# Patient Record
Sex: Male | Born: 1965 | Race: White | Hispanic: No | Marital: Married | Smoking: Former smoker
Health system: Southern US, Community
[De-identification: ages and names within clinical notes are randomized; demographics above are authoritative.]

## PROBLEM LIST (undated history)

## (undated) DIAGNOSIS — R809 Proteinuria, unspecified: Secondary | ICD-10-CM

## (undated) DIAGNOSIS — L97329 Non-pressure chronic ulcer of left ankle with unspecified severity: Secondary | ICD-10-CM

## (undated) DIAGNOSIS — E781 Pure hyperglyceridemia: Secondary | ICD-10-CM

## (undated) DIAGNOSIS — I10 Essential (primary) hypertension: Secondary | ICD-10-CM

## (undated) DIAGNOSIS — I639 Cerebral infarction, unspecified: Secondary | ICD-10-CM

## (undated) DIAGNOSIS — K219 Gastro-esophageal reflux disease without esophagitis: Secondary | ICD-10-CM

## (undated) DIAGNOSIS — T07XXXA Unspecified multiple injuries, initial encounter: Secondary | ICD-10-CM

## (undated) DIAGNOSIS — N183 Chronic kidney disease, stage 3 unspecified: Secondary | ICD-10-CM

## (undated) DIAGNOSIS — E1129 Type 2 diabetes mellitus with other diabetic kidney complication: Secondary | ICD-10-CM

## (undated) DIAGNOSIS — G8929 Other chronic pain: Secondary | ICD-10-CM

## (undated) DIAGNOSIS — N185 Chronic kidney disease, stage 5: Secondary | ICD-10-CM

## (undated) DIAGNOSIS — F209 Schizophrenia, unspecified: Secondary | ICD-10-CM

## (undated) DIAGNOSIS — I501 Left ventricular failure: Secondary | ICD-10-CM

## (undated) DIAGNOSIS — E785 Hyperlipidemia, unspecified: Secondary | ICD-10-CM

## (undated) DIAGNOSIS — G4730 Sleep apnea, unspecified: Secondary | ICD-10-CM

## (undated) DIAGNOSIS — E039 Hypothyroidism, unspecified: Secondary | ICD-10-CM

## (undated) DIAGNOSIS — I82402 Acute embolism and thrombosis of unspecified deep veins of left lower extremity: Secondary | ICD-10-CM

## (undated) DIAGNOSIS — F329 Major depressive disorder, single episode, unspecified: Secondary | ICD-10-CM

## (undated) DIAGNOSIS — E669 Obesity, unspecified: Secondary | ICD-10-CM

## (undated) DIAGNOSIS — M1990 Unspecified osteoarthritis, unspecified site: Secondary | ICD-10-CM

## (undated) DIAGNOSIS — E038 Other specified hypothyroidism: Secondary | ICD-10-CM

## (undated) DIAGNOSIS — F419 Anxiety disorder, unspecified: Secondary | ICD-10-CM

## (undated) DIAGNOSIS — M2550 Pain in unspecified joint: Secondary | ICD-10-CM

## (undated) DIAGNOSIS — M869 Osteomyelitis, unspecified: Secondary | ICD-10-CM

## (undated) DIAGNOSIS — D649 Anemia, unspecified: Secondary | ICD-10-CM

## (undated) DIAGNOSIS — F32A Depression, unspecified: Secondary | ICD-10-CM

## (undated) HISTORY — DX: Hypothyroidism, unspecified: E03.9

## (undated) HISTORY — DX: Pure hyperglyceridemia: E78.1

## (undated) HISTORY — DX: Acute embolism and thrombosis of unspecified deep veins of left lower extremity: I82.402

## (undated) HISTORY — DX: Type 2 diabetes mellitus with other diabetic kidney complication: E11.29

## (undated) HISTORY — DX: Obesity, unspecified: E66.9

## (undated) HISTORY — DX: Other specified hypothyroidism: E03.8

## (undated) HISTORY — PX: APPENDECTOMY: SHX54

## (undated) HISTORY — DX: Anxiety disorder, unspecified: F41.9

## (undated) HISTORY — DX: Major depressive disorder, single episode, unspecified: F32.9

## (undated) HISTORY — DX: Left ventricular failure, unspecified: I50.1

## (undated) HISTORY — DX: Unspecified multiple injuries, initial encounter: T07.XXXA

## (undated) HISTORY — DX: Non-pressure chronic ulcer of left ankle with unspecified severity: L97.329

## (undated) HISTORY — DX: Unspecified osteoarthritis, unspecified site: M19.90

## (undated) HISTORY — DX: Pain in unspecified joint: M25.50

## (undated) HISTORY — DX: Type 2 diabetes mellitus with other diabetic kidney complication: R80.9

## (undated) HISTORY — DX: Cerebral infarction, unspecified: I63.9

## (undated) HISTORY — DX: Chronic kidney disease, stage 5: N18.5

## (undated) HISTORY — DX: Osteomyelitis, unspecified: M86.9

## (undated) HISTORY — DX: Depression, unspecified: F32.A

---

## 2004-10-10 ENCOUNTER — Ambulatory Visit: Payer: Self-pay | Admitting: Occupational Therapy

## 2007-04-23 ENCOUNTER — Inpatient Hospital Stay: Payer: Self-pay | Admitting: Psychiatry

## 2008-03-09 ENCOUNTER — Ambulatory Visit: Payer: Self-pay | Admitting: Pain Medicine

## 2008-09-04 ENCOUNTER — Emergency Department (HOSPITAL_COMMUNITY): Admission: EM | Admit: 2008-09-04 | Discharge: 2008-09-04 | Payer: Self-pay | Admitting: Emergency Medicine

## 2008-11-01 ENCOUNTER — Inpatient Hospital Stay (HOSPITAL_COMMUNITY): Admission: EM | Admit: 2008-11-01 | Discharge: 2008-11-04 | Payer: Self-pay | Admitting: Emergency Medicine

## 2008-11-14 ENCOUNTER — Inpatient Hospital Stay (HOSPITAL_COMMUNITY): Admission: EM | Admit: 2008-11-14 | Discharge: 2008-11-16 | Payer: Self-pay | Admitting: Emergency Medicine

## 2008-11-14 ENCOUNTER — Ambulatory Visit: Payer: Self-pay | Admitting: Cardiology

## 2008-11-15 ENCOUNTER — Other Ambulatory Visit: Payer: Self-pay | Admitting: Internal Medicine

## 2008-11-15 ENCOUNTER — Ambulatory Visit: Payer: Self-pay | Admitting: Internal Medicine

## 2008-11-15 ENCOUNTER — Encounter: Payer: Self-pay | Admitting: Cardiology

## 2008-11-16 ENCOUNTER — Other Ambulatory Visit: Payer: Self-pay | Admitting: Internal Medicine

## 2009-06-20 ENCOUNTER — Emergency Department (HOSPITAL_COMMUNITY): Admission: EM | Admit: 2009-06-20 | Discharge: 2009-06-20 | Payer: Self-pay | Admitting: Emergency Medicine

## 2010-03-23 ENCOUNTER — Ambulatory Visit: Payer: Self-pay | Admitting: Gastroenterology

## 2010-05-30 ENCOUNTER — Inpatient Hospital Stay (HOSPITAL_COMMUNITY): Admission: EM | Admit: 2010-05-30 | Discharge: 2010-06-01 | Payer: Self-pay | Admitting: Emergency Medicine

## 2010-05-31 ENCOUNTER — Encounter (INDEPENDENT_AMBULATORY_CARE_PROVIDER_SITE_OTHER): Payer: Self-pay | Admitting: General Surgery

## 2010-06-12 ENCOUNTER — Emergency Department (HOSPITAL_COMMUNITY): Admission: EM | Admit: 2010-06-12 | Discharge: 2010-06-12 | Payer: Self-pay | Admitting: Emergency Medicine

## 2011-01-12 LAB — HEPATIC FUNCTION PANEL
AST: 16 U/L (ref 0–37)
Albumin: 4.3 g/dL (ref 3.5–5.2)
Alkaline Phosphatase: 75 U/L (ref 39–117)
Bilirubin, Direct: 0.1 mg/dL (ref 0.0–0.3)
Indirect Bilirubin: 0.9 mg/dL (ref 0.3–0.9)
Total Protein: 6.8 g/dL (ref 6.0–8.3)

## 2011-01-12 LAB — GLUCOSE, CAPILLARY
Glucose-Capillary: 307 mg/dL — ABNORMAL HIGH (ref 70–99)
Glucose-Capillary: 183 mg/dL — ABNORMAL HIGH (ref 70–99)
Glucose-Capillary: 193 mg/dL — ABNORMAL HIGH (ref 70–99)
Glucose-Capillary: 226 mg/dL — ABNORMAL HIGH (ref 70–99)
Glucose-Capillary: 244 mg/dL — ABNORMAL HIGH (ref 70–99)
Glucose-Capillary: 272 mg/dL — ABNORMAL HIGH (ref 70–99)
Glucose-Capillary: 286 mg/dL — ABNORMAL HIGH (ref 70–99)

## 2011-01-12 LAB — COMPREHENSIVE METABOLIC PANEL
ALT: 13 U/L (ref 0–53)
AST: 13 U/L (ref 0–37)
Albumin: 4.2 g/dL (ref 3.5–5.2)
Alkaline Phosphatase: 71 U/L (ref 39–117)
BUN: 7 mg/dL (ref 6–23)
CO2: 24 mEq/L (ref 19–32)
Chloride: 90 mEq/L — ABNORMAL LOW (ref 96–112)
Chloride: 104 mEq/L (ref 96–112)
Creatinine, Ser: 1.09 mg/dL (ref 0.4–1.5)
Creatinine, Ser: 0.89 mg/dL (ref 0.4–1.5)
GFR calc Af Amer: >60 mL/min (ref >60)
GFR calc non Af Amer: >60 mL/min (ref >60)
GFR calc non Af Amer: >60 mL/min (ref >60)
Glucose, Bld: 535 mg/dL — ABNORMAL HIGH (ref 70–99)
Glucose, Bld: 260 mg/dL — ABNORMAL HIGH (ref 70–99)
Total Bilirubin: 1.3 mg/dL — ABNORMAL HIGH (ref 0.3–1.2)

## 2011-01-12 LAB — URINALYSIS, ROUTINE W REFLEX MICROSCOPIC
Hgb urine dipstick: NEGATIVE
Ketones, ur: NEGATIVE mg/dL
Nitrite: NEGATIVE
Specific Gravity, Urine: >1.03 — ABNORMAL HIGH (ref 1.005–1.030)
Urobilinogen, UA: 0.2 mg/dL (ref 0.0–1.0)

## 2011-01-12 LAB — CBC
HCT: 44.2 % (ref 39.0–52.0)
Hemoglobin: 15 g/dL (ref 13.0–17.0)
Hemoglobin: 11.8 g/dL — ABNORMAL LOW (ref 13.0–17.0)
Hemoglobin: 13.9 g/dL (ref 13.0–17.0)
MCH: 29.7 pg (ref 26.0–34.0)
MCH: 30 pg (ref 26.0–34.0)
MCV: 85.1 fL (ref 78.0–100.0)
RBC: 4.68 MIL/uL (ref 4.22–5.81)
RDW: 13.1 % (ref 11.5–15.5)
WBC: 10.5 10*3/uL (ref 4.0–10.5)

## 2011-01-12 LAB — BASIC METABOLIC PANEL
BUN: 11 mg/dL (ref 6–23)
CO2: 19 mEq/L (ref 19–32)
Calcium: 9.5 mg/dL (ref 8.4–10.5)
Chloride: 101 mEq/L (ref 96–112)
Creatinine, Ser: 1.07 mg/dL (ref 0.4–1.5)
GFR calc Af Amer: >60 mL/min (ref >60)
GFR calc non Af Amer: >60 mL/min (ref >60)
Glucose, Bld: 393 mg/dL — ABNORMAL HIGH (ref 70–99)
Sodium: 132 mEq/L — ABNORMAL LOW (ref 135–145)

## 2011-01-12 LAB — DIFFERENTIAL
Basophils Absolute: 0 10*3/uL (ref 0.0–0.1)
Eosinophils Absolute: 0 10*3/uL (ref 0.0–0.7)
Eosinophils Absolute: 0.1 10*3/uL (ref 0.0–0.7)
Eosinophils Relative: 0 % (ref 0–5)
Eosinophils Relative: 1 % (ref 0–5)
Lymphocytes Relative: 19 % (ref 12–46)
Lymphs Abs: 2 10*3/uL (ref 0.7–4.0)
Monocytes Absolute: 0.6 10*3/uL (ref 0.1–1.0)
Monocytes Relative: 5 % (ref 3–12)
Neutro Abs: 6 10*3/uL (ref 1.7–7.7)
Neutro Abs: 6.1 10*3/uL (ref 1.7–7.7)
Neutrophils Relative %: 70 % (ref 43–77)

## 2011-01-12 LAB — CULTURE, BLOOD (ROUTINE X 2)
Culture: NO GROWTH
Report Status: 8202011
Report Status: 8202011

## 2011-01-12 LAB — LACTIC ACID, PLASMA: Lactic Acid, Venous: 1.7 mmol/L (ref 0.5–2.2)

## 2011-01-27 ENCOUNTER — Inpatient Hospital Stay (HOSPITAL_COMMUNITY)
Admission: EM | Admit: 2011-01-27 | Discharge: 2011-01-28 | DRG: 897 | Disposition: A | Discharge status: Home / Self Care | Payer: Medicaid Other | Attending: Internal Medicine | Admitting: Internal Medicine

## 2011-01-27 DIAGNOSIS — G609 Hereditary and idiopathic neuropathy, unspecified: Secondary | ICD-10-CM | POA: Diagnosis present

## 2011-01-27 DIAGNOSIS — IMO0001 Reserved for inherently not codable concepts without codable children: Secondary | ICD-10-CM | POA: Diagnosis present

## 2011-01-27 DIAGNOSIS — G894 Chronic pain syndrome: Secondary | ICD-10-CM | POA: Diagnosis present

## 2011-01-27 DIAGNOSIS — E876 Hypokalemia: Secondary | ICD-10-CM | POA: Diagnosis present

## 2011-01-27 DIAGNOSIS — Z794 Long term (current) use of insulin: Secondary | ICD-10-CM

## 2011-01-27 DIAGNOSIS — Z885 Allergy status to narcotic agent status: Secondary | ICD-10-CM

## 2011-01-27 DIAGNOSIS — F1120 Opioid dependence, uncomplicated: Secondary | ICD-10-CM | POA: Diagnosis present

## 2011-01-27 DIAGNOSIS — F19939 Other psychoactive substance use, unspecified with withdrawal, unspecified: Principal | ICD-10-CM | POA: Diagnosis present

## 2011-01-27 DIAGNOSIS — E785 Hyperlipidemia, unspecified: Secondary | ICD-10-CM | POA: Diagnosis present

## 2011-01-27 DIAGNOSIS — E860 Dehydration: Secondary | ICD-10-CM | POA: Diagnosis present

## 2011-01-27 LAB — DIFFERENTIAL
Basophils Absolute: 0 10*3/uL (ref 0.0–0.1)
Basophils Relative: 0 % (ref 0–1)
Eosinophils Absolute: 0 10*3/uL (ref 0.0–0.7)
Monocytes Absolute: 0.4 10*3/uL (ref 0.1–1.0)
Neutro Abs: 4.2 10*3/uL (ref 1.7–7.7)

## 2011-01-27 LAB — ETHANOL: Alcohol, Ethyl (B): <5 mg/dL (ref 0–10)

## 2011-01-27 LAB — COMPREHENSIVE METABOLIC PANEL
ALT: 17 U/L (ref 0–53)
Alkaline Phosphatase: 70 U/L (ref 39–117)
BUN: 10 mg/dL (ref 6–23)
CO2: 23 mEq/L (ref 19–32)
Chloride: 93 mEq/L — ABNORMAL LOW (ref 96–112)
GFR calc non Af Amer: >60 mL/min (ref >60)
Glucose, Bld: 427 mg/dL — ABNORMAL HIGH (ref 70–99)
Potassium: 3.2 mEq/L — ABNORMAL LOW (ref 3.5–5.1)
Total Bilirubin: 1.8 mg/dL — ABNORMAL HIGH (ref 0.3–1.2)

## 2011-01-27 LAB — CBC
Hemoglobin: 15.8 g/dL (ref 13.0–17.0)
MCHC: 36.8 g/dL — ABNORMAL HIGH (ref 30.0–36.0)
Platelets: 252 10*3/uL (ref 150–400)
RDW: 12.9 % (ref 11.5–15.5)

## 2011-01-27 LAB — RAPID URINE DRUG SCREEN, HOSP PERFORMED
Amphetamines: NOT DETECTED
Barbiturates: NOT DETECTED
Opiates: POSITIVE — AB

## 2011-01-27 LAB — GLUCOSE, CAPILLARY: Glucose-Capillary: 201 mg/dL — ABNORMAL HIGH (ref 70–99)

## 2011-01-28 LAB — CBC
HCT: 34.6 % — ABNORMAL LOW (ref 39.0–52.0)
MCH: 28.3 pg (ref 26.0–34.0)
MCV: 79.7 fL (ref 78.0–100.0)
Platelets: 208 10*3/uL (ref 150–400)
RDW: 13 % (ref 11.5–15.5)

## 2011-01-28 LAB — DIFFERENTIAL
Eosinophils Absolute: 0 10*3/uL (ref 0.0–0.7)
Eosinophils Relative: 0 % (ref 0–5)
Lymphocytes Relative: 31 % (ref 12–46)
Lymphs Abs: 1.7 10*3/uL (ref 0.7–4.0)
Monocytes Relative: 6 % (ref 3–12)

## 2011-01-28 LAB — COMPREHENSIVE METABOLIC PANEL
Albumin: 3.3 g/dL — ABNORMAL LOW (ref 3.5–5.2)
Alkaline Phosphatase: 48 U/L (ref 39–117)
BUN: 8 mg/dL (ref 6–23)
Chloride: 107 mEq/L (ref 96–112)
Creatinine, Ser: 0.83 mg/dL (ref 0.4–1.5)
Glucose, Bld: 207 mg/dL — ABNORMAL HIGH (ref 70–99)
Potassium: 2.9 mEq/L — ABNORMAL LOW (ref 3.5–5.1)
Total Bilirubin: 1.3 mg/dL — ABNORMAL HIGH (ref 0.3–1.2)

## 2011-01-28 LAB — PHOSPHORUS: Phosphorus: 2.4 mg/dL (ref 2.3–4.6)

## 2011-01-28 LAB — MAGNESIUM: Magnesium: 1.8 mg/dL (ref 1.5–2.5)

## 2011-01-28 LAB — GLUCOSE, CAPILLARY: Glucose-Capillary: 249 mg/dL — ABNORMAL HIGH (ref 70–99)

## 2011-01-29 NOTE — H&P (Addendum)
NAMESAMYOG, MOULTRIE NO.:  [id number]  MEDICAL RECORD NO.:  [id number]           PATIENT TYPE:  LOCATION:                                 FACILITY:  PHYSICIAN:  Erma Pinto, MD       DATE OF BIRTH:  07-12-1966  DATE OF ADMISSION: DATE OF DISCHARGE:  LH                             HISTORY & PHYSICAL   PRIMARY CARE PHYSICIAN:  Unassigned.  He is followed by pain physician, Dr. Staci Righter, his number is [phone].  CHIEF COMPLAINT:  Withdrawal symptoms.  HISTORY OF PRESENT ILLNESS:  [name] is a 45 year old Caucasian male who is on morphine for chronic pain for peripheral neuropathy and upper back and shoulder pain which is secondary to scar tissue from surgery he had for a large abscess in the upper back.  He is followed by [facility] and had been on high-dose morphine 120 mg XL three times a day and immediate release morphine 30 mg twice a day according to the patient.  He was taken off of the morphine and put on Lortab 10/325 q.i.d. 2 days ago and very shortly after that, he began having muscle cramps, nausea, vomiting, diaphoresis, just feeling really bad.  The patient also has diabetes, so he will get himself very dehydrated.  PAST MEDICAL HISTORY:  Hypertension, diabetes, hyperlipidemia, depression, and peripheral neuropathy, chronic pain.  PAST SURGICAL HISTORY:  He had appendectomy in August 2011 and had surgery for an abscess in his upper back, which has caused scar tissue and pain from it.  SOCIAL HISTORY:  He is disabled, married.  Does not drink, smoke, or use illicit drugs.  FAMILY HISTORY:  Noncontributory.  REVIEW OF SYSTEMS:  As per history of present illness.  LABORATORY DATA:  His white count is 5900, hemoglobin is 15.8, hematocrit 42.9 with normal differential.  His chemistry, his glucose originally was in the 400, his sodium 135, potassium 3.2, chloride 93, CO2 23, glucose initially was 427, most recent glucose was 201, BUN  is 10, creatinine 0.98, total bili is up 1.8, alkaline phosphatase 70. Rest of his comprehensive metabolic panel is normal.  Urine drug screen is pending.  X-rays, no chest x-ray was obtained.  The patient received in the emergency room Zofran 4 mg x1, then Ativan 2 mg x1, then morphine 4 mg x1, insulin he received 10 units IV push, Phenergan IV push times second time, and then Phenergan a third time. He has had another Ativan 1 mg IV and he has got IV fluids, normal saline at 150 mL.  PHYSICAL EXAMINATION:  GENERAL:  He initially was in lot of distress but his distress is resolving after receiving treatment in the ER. VITAL SIGNS:  Blood pressure is 154/86, pulse 87, respirations 24, temperature 97.9. HEENT:  Head is atraumatic, normocephalic.  Eyes, pupils equal, round, reactive to light.  Disk is sharp.  Extraocular muscle range of motion is full.  His sclerae are anicteric.  Ear, nose, and throat, mucous membranes are dry.  There is no erythema, exudate, or lesion noted. CHEST:  Nontender. LUNGS:  Clear to auscultation and percussion.  HEART:  Regular rhythm and rate.  Normal S1 and S2 without murmur, gallop, or rub. ABDOMEN:  He has diffuse tenderness.  His abdomen is soft.  There is no hepatomegaly.  No splenomegaly.  No palpable mass. GENITALIA:  Normal.  Rectal exam was deferred at the patient's request. SKIN:  There is no rash or lesions noted. NEUROLOGIC:  He is awake, alert, and oriented x4.  Cranial nerves II-XII intact.  Motor strength is 5/5 in upper and lower extremities.  Sensory intact to fine touch and pinprick. LYMPH NODES:  Normal. MENTAL STATUS:  Normal.  IMPRESSION: 1. Morphine withdrawal. 2. Hyperglycemic state. 3. Dehydration. 4. Nausea and vomiting. 5. Hypokalemia.  PLAN:  Admit the patient for IV hydration, monitor electrolytes, and replace electrolytes as needed.  Put the patient on telemetry, continue with Ativan 2 mg IV every 2 hours, put him on  morphine 4 mg IV every 2 hours as needed, clonidine 0.1 mg p.o. t.i.d. once he is able to take p.o. fluids and put him on a sliding scale and check sugars every 4 hours and then a.c. and at bedtime as tolerated.  The patient should be put on a methadone or Suboxone alternative to withdraw from the morphine, but I will leave that to the choice of the rounding hospitalist.  The patient will be admitted to [facility] Team 1.     Erma Pinto, MD     ML/MEDQ  D:  01/27/2011  T:  01/28/2011  Job:  [id number]  Electronically Signed by Erma Pinto  on 01/29/2011 11:13:57 AM

## 2011-02-03 LAB — CBC
HCT: 37.3 % — ABNORMAL LOW (ref 39.0–52.0)
MCV: 87.8 fL (ref 78.0–100.0)
Platelets: 207 10*3/uL (ref 150–400)
RDW: 12 % (ref 11.5–15.5)
WBC: 3.7 10*3/uL — ABNORMAL LOW (ref 4.0–10.5)

## 2011-02-03 LAB — DIFFERENTIAL
Basophils Absolute: 0 10*3/uL (ref 0.0–0.1)
Basophils Relative: 1 % (ref 0–1)
Eosinophils Absolute: 0.1 10*3/uL (ref 0.0–0.7)
Eosinophils Relative: 4 % (ref 0–5)
Lymphs Abs: 1.4 10*3/uL (ref 0.7–4.0)
Neutrophils Relative %: 51 % (ref 43–77)

## 2011-02-03 LAB — BASIC METABOLIC PANEL
BUN: 8 mg/dL (ref 6–23)
Chloride: 100 mEq/L (ref 96–112)
Creatinine, Ser: 0.8 mg/dL (ref 0.4–1.5)
GFR calc non Af Amer: >60 mL/min (ref >60)
Glucose, Bld: 318 mg/dL — ABNORMAL HIGH (ref 70–99)

## 2011-02-12 LAB — HEMOGLOBIN A1C: Hgb A1c MFr Bld: 7.1 % — ABNORMAL HIGH (ref 4.6–6.1)

## 2011-02-12 LAB — CBC
HCT: 37.4 % — ABNORMAL LOW (ref 39.0–52.0)
HCT: 37.5 % — ABNORMAL LOW (ref 39.0–52.0)
HCT: 36.4 % — ABNORMAL LOW (ref 39.0–52.0)
Hemoglobin: 13.1 g/dL (ref 13.0–17.0)
Hemoglobin: 13.2 g/dL (ref 13.0–17.0)
Hemoglobin: 15.1 g/dL (ref 13.0–17.0)
Hemoglobin: 12.8 g/dL — ABNORMAL LOW (ref 13.0–17.0)
Hemoglobin: 12.3 g/dL — ABNORMAL LOW (ref 13.0–17.0)
MCHC: 34.9 g/dL (ref 30.0–36.0)
MCHC: 35.3 g/dL (ref 30.0–36.0)
MCHC: 34.1 g/dL (ref 30.0–36.0)
MCHC: 35 g/dL (ref 30.0–36.0)
MCV: 92.2 fL (ref 78.0–100.0)
MCV: 92.7 fL (ref 78.0–100.0)
Platelets: 174 10*3/uL (ref 150–400)
Platelets: 185 10*3/uL (ref 150–400)
Platelets: 216 10*3/uL (ref 150–400)
RBC: 4.02 MIL/uL — ABNORMAL LOW (ref 4.22–5.81)
RBC: 4.69 MIL/uL (ref 4.22–5.81)
RBC: 3.81 MIL/uL — ABNORMAL LOW (ref 4.22–5.81)
RDW: 11.6 % (ref 11.5–15.5)
RDW: 12 % (ref 11.5–15.5)
RDW: 11.9 % (ref 11.5–15.5)
RDW: 12 % (ref 11.5–15.5)
WBC: 5.6 10*3/uL (ref 4.0–10.5)
WBC: 9 10*3/uL (ref 4.0–10.5)
WBC: 4 10*3/uL (ref 4.0–10.5)
WBC: 3.6 10*3/uL — ABNORMAL LOW (ref 4.0–10.5)

## 2011-02-12 LAB — GLUCOSE, CAPILLARY
Glucose-Capillary: 101 mg/dL — ABNORMAL HIGH (ref 70–99)
Glucose-Capillary: 143 mg/dL — ABNORMAL HIGH (ref 70–99)
Glucose-Capillary: 149 mg/dL — ABNORMAL HIGH (ref 70–99)
Glucose-Capillary: 236 mg/dL — ABNORMAL HIGH (ref 70–99)
Glucose-Capillary: 62 mg/dL — ABNORMAL LOW (ref 70–99)
Glucose-Capillary: 79 mg/dL (ref 70–99)
Glucose-Capillary: 115 mg/dL — ABNORMAL HIGH (ref 70–99)
Glucose-Capillary: 177 mg/dL — ABNORMAL HIGH (ref 70–99)
Glucose-Capillary: 206 mg/dL — ABNORMAL HIGH (ref 70–99)
Glucose-Capillary: 247 mg/dL — ABNORMAL HIGH (ref 70–99)

## 2011-02-12 LAB — BASIC METABOLIC PANEL
BUN: 6 mg/dL (ref 6–23)
BUN: 9 mg/dL (ref 6–23)
CO2: 25 mEq/L (ref 19–32)
CO2: 27 mEq/L (ref 19–32)
CO2: 32 mEq/L (ref 19–32)
CO2: 27 mEq/L (ref 19–32)
CO2: 25 mEq/L (ref 19–32)
Calcium: 8.8 mg/dL (ref 8.4–10.5)
Calcium: 9.1 mg/dL (ref 8.4–10.5)
Calcium: 9.4 mg/dL (ref 8.4–10.5)
Calcium: 9.2 mg/dL (ref 8.4–10.5)
Calcium: 8.6 mg/dL (ref 8.4–10.5)
Chloride: 99 mEq/L (ref 96–112)
Chloride: 107 mEq/L (ref 96–112)
Chloride: 108 mEq/L (ref 96–112)
Creatinine, Ser: 0.83 mg/dL (ref 0.4–1.5)
Creatinine, Ser: 0.92 mg/dL (ref 0.4–1.5)
Creatinine, Ser: 0.92 mg/dL (ref 0.4–1.5)
GFR calc Af Amer: >60 mL/min (ref >60)
GFR calc Af Amer: >60 mL/min (ref >60)
GFR calc non Af Amer: >60 mL/min (ref >60)
GFR calc non Af Amer: >60 mL/min (ref >60)
GFR calc non Af Amer: >60 mL/min (ref >60)
GFR calc non Af Amer: >60 mL/min (ref >60)
Glucose, Bld: 100 mg/dL — ABNORMAL HIGH (ref 70–99)
Glucose, Bld: 176 mg/dL — ABNORMAL HIGH (ref 70–99)
Glucose, Bld: 209 mg/dL — ABNORMAL HIGH (ref 70–99)
Glucose, Bld: 93 mg/dL (ref 70–99)
Glucose, Bld: 140 mg/dL — ABNORMAL HIGH (ref 70–99)
Potassium: 3.3 mEq/L — ABNORMAL LOW (ref 3.5–5.1)
Potassium: 4 mEq/L (ref 3.5–5.1)
Sodium: 133 mEq/L — ABNORMAL LOW (ref 135–145)
Sodium: 134 mEq/L — ABNORMAL LOW (ref 135–145)
Sodium: 137 mEq/L (ref 135–145)
Sodium: 138 mEq/L (ref 135–145)
Sodium: 142 mEq/L (ref 135–145)

## 2011-02-12 LAB — DIFFERENTIAL
Basophils Absolute: 0 10*3/uL (ref 0.0–0.1)
Basophils Absolute: 0 10*3/uL (ref 0.0–0.1)
Basophils Absolute: 0 10*3/uL (ref 0.0–0.1)
Basophils Absolute: 0 10*3/uL (ref 0.0–0.1)
Basophils Relative: 0 % (ref 0–1)
Basophils Relative: 0 % (ref 0–1)
Basophils Relative: 1 % (ref 0–1)
Basophils Relative: 1 % (ref 0–1)
Eosinophils Absolute: 0.1 10*3/uL (ref 0.0–0.7)
Eosinophils Absolute: 0.2 10*3/uL (ref 0.0–0.7)
Eosinophils Relative: 2 % (ref 0–5)
Eosinophils Relative: 3 % (ref 0–5)
Eosinophils Relative: 3 % (ref 0–5)
Eosinophils Relative: 4 % (ref 0–5)
Lymphocytes Relative: 14 % (ref 12–46)
Lymphocytes Relative: 27 % (ref 12–46)
Lymphocytes Relative: 20 % (ref 12–46)
Lymphs Abs: 1.3 10*3/uL (ref 0.7–4.0)
Lymphs Abs: 1.5 10*3/uL (ref 0.7–4.0)
Lymphs Abs: 0.8 10*3/uL (ref 0.7–4.0)
Monocytes Absolute: 0.5 10*3/uL (ref 0.1–1.0)
Monocytes Absolute: 0.5 10*3/uL (ref 0.1–1.0)
Monocytes Absolute: 0.5 10*3/uL (ref 0.1–1.0)
Monocytes Absolute: 0.5 10*3/uL (ref 0.1–1.0)
Monocytes Absolute: 0.4 10*3/uL (ref 0.1–1.0)
Monocytes Relative: 10 % (ref 3–12)
Monocytes Relative: 6 % (ref 3–12)
Neutro Abs: 2.5 10*3/uL (ref 1.7–7.7)
Neutro Abs: 3.9 10*3/uL (ref 1.7–7.7)
Neutro Abs: 7 10*3/uL (ref 1.7–7.7)
Neutro Abs: 2.5 10*3/uL (ref 1.7–7.7)
Neutrophils Relative %: 58 % (ref 43–77)
Neutrophils Relative %: 78 % — ABNORMAL HIGH (ref 43–77)
Neutrophils Relative %: 70 % (ref 43–77)

## 2011-02-12 LAB — CULTURE, BLOOD (ROUTINE X 2)
Culture: NO GROWTH
Culture: NO GROWTH
Culture: NO GROWTH
Report Status: 1232010

## 2011-02-12 LAB — CARDIAC PANEL(CRET KIN+CKTOT+MB+TROPI)
CK, MB: 0.9 ng/mL (ref 0.3–4.0)
CK, MB: 0.7 ng/mL (ref 0.3–4.0)
Relative Index: INVALID (ref 0.0–2.5)
Total CK: 50 U/L (ref 7–232)
Total CK: 48 U/L (ref 7–232)

## 2011-02-12 LAB — POCT CARDIAC MARKERS
CKMB, poc: <1 ng/mL — ABNORMAL LOW (ref 1.0–8.0)
Myoglobin, poc: 29.7 ng/mL (ref 12–200)
Troponin i, poc: <0.05 ng/mL (ref 0.00–0.09)

## 2011-02-12 LAB — LIPID PANEL
Cholesterol: 178 mg/dL (ref 0–200)
HDL: 35 mg/dL — ABNORMAL LOW (ref >39)
Total CHOL/HDL Ratio: 5.1 RATIO
Total CHOL/HDL Ratio: 4.6 RATIO
VLDL: 38 mg/dL (ref 0–40)

## 2011-02-12 LAB — D-DIMER, QUANTITATIVE: D-Dimer, Quant: 0.6 ug/mL-FEU — ABNORMAL HIGH (ref 0.00–0.48)

## 2011-02-14 NOTE — Discharge Summary (Signed)
Joe [name], [name]                ACCOUNT NO.:  [id number]  MEDICAL RECORD NO.:  [id number]           PATIENT TYPE:  I  LOCATION:  [id number]                          FACILITY:  [facility]  PHYSICIAN:  Eldin Bonsell L. Conley Canal, MDDATE OF BIRTH:  [birth year]  DATE OF ADMISSION:  01/27/2011 DATE OF DISCHARGE:  04/01/2012LH                              DISCHARGE SUMMARY   DISCHARGE DIAGNOSES: 1. Opiate withdrawal. 2. Chronic pain syndrome. 3. Diabetes. 4. Peripheral neuropathy. 5. Hypokalemia. 6. Uncontrolled diabetes. 7. Dehydration. 8. Hyperlipidemia.  DISCHARGE MEDICATIONS: 1. Dilaudid 2 mg 1-2 tablets p.o. q.4 h. p.r.n. pain, 30 were     dispensed. 2. Phenergan 25 mg p.o. q.6 h. p.r.n. nausea. 3. Potassium chloride 10 mEq 2 tablets q.i.d. for 2 days. 4. Continue alprazolam 1 mg p.o. q.i.d. p.r.n. anxiety. 5. Baclofen 10 mg p.o. q.i.d. p.r.n. muscle spasms. 6. Cymbalta 60 mg 2 tablets nightly. 7. Geodon 60 mg 2 tablets nightly. 8. Hydrocodone/APAP 10/500 one to two tablets p.o. q.4 h. p.r.n. pain. 9. Lantus insulin 90 units subcutaneously nightly. 10.Lisinopril 20 mg 2 tablets daily. 11.Lopid 600 mg a day. 12.Multivitamin a day. 13.Sliding scale NovoLog. 14.Stop MS Contin and MSIR.  CONDITION:  Stable.  CONSULTATIONS:  None.  PROCEDURES:  None.  FOLLOWUP:  Call his pain management physician tomorrow for instructions.  ACTIVITY:  Ad lib.  DIET:  Should be heart healthy, diabetic.  LABORATORY DATA:  CBC normal.  Complete metabolic panel significant for a potassium of 3.2 on admission, at discharge potassium is 2.9 and he is being repleted orally and IV.  Magnesium normal.  Total bilirubin on admission was 1.8, at discharge 1.3.  Glucose on admission 427, at discharge, ranging about 200.  Urine drug screen positive for benzodiazepines and opiates.  Blood alcohol less than 5.  HISTORY AND HOSPITAL COURSE:  Please see H and P for details.  Joe [name] is a 45 year old white male  with a history of chronic pain and chronic opiate use who presented with opiate withdrawal symptoms.  He was having severe pain everywhere.  He was vomiting, had diaphoresis, cramps, and diarrhea.  He had been on high-dose morphine 120 mg t.i.d. and immediate release morphine 30 mg twice a day, but had been switched to Lortab per his request as the morphine was no longer working. Apparently, it was felt that he had narco-toxicity with morphine.  The patient on admission had normal vital signs.  He had diffuse abdominal tenderness and a soft abdomen.  He was admitted for supportive care.  He was given IV fluids, IV potassium, antiemetics, clonidine, Ativan, and pain medication.  At the time of discharge, he is tolerating a regular diet.  He still has pain, but he is no longer having cramps, vomiting, or diarrhea.  He is requesting discharge and will call his pain management physician tomorrow.  He is unable to take more of his hydrocodone due to the Tylenol component.  He is allergic to OXYCODONE and does not wish to take morphine.  I have therefore given him 30 tablets of 2 mg Dilaudid.  I have also given him Phenergan and potassium  to take with him.  He is medically stable for discharge.     Annalynn Centanni L. Conley Canal, MD     CLS/MEDQ  D:  01/28/2011  T:  01/28/2011  Job:  [id number]  cc:   [facility] [address] [place], [place] [address]  Electronically Signed by Doree Barthel MD on 02/14/2011 09:33:36 PM

## 2011-03-13 NOTE — H&P (Signed)
[name], [name]                ACCOUNT NO.:  [id number]   MEDICAL RECORD NO.:  [id number]          PATIENT TYPE:  INP   LOCATION:  A310                          FACILITY:  [facility]   PHYSICIAN:  Salem Caster, DO    DATE OF BIRTH:  October 03, 1966   DATE OF ADMISSION:  11/14/2008  DATE OF DISCHARGE:  LH                              HISTORY & PHYSICAL   PRIMARY CARE PHYSICIAN:  [facility].   CHIEF COMPLAINT:  Chest pain.   HISTORY OF PRESENT ILLNESS:  This is a 45 year old Caucasian male who  presents with a 1-day history of intermittent substernal chest pain with  some palpitations with associated shortness of breath.  The patient was  recently seen the week prior at [facility] for a foot infection  and was sent home with IV vancomycin to a PICC line.  The patient states  that he is due to have his PICC line discontinued soon.  The patient  states that the pain is sharp in nature, is substernal with some  radiation to his abdomen and is intermittent.  The patient does have  some associated shortness of breath.  Denies any nausea, vomiting,  diarrhea.  The patient states that his foot still hurts.  It is better  but still has some swelling and pain.  The patient has no acute distress  on my exam at this time.   PAST MEDICAL HISTORY:  Includes:  1. Diabetes with severe peripheral neuropathy.  2. Dyslipidemia.  3. Hypertension.  4. Depression.   DRUG ALLERGIES:  On METFORMIN and PERCOCET.   HOME MEDICATIONS:  1. Lortab 10/325 mg 2 tablets 3 times a day.  2. Cymbalta 120 mg at bedtime.  3. Geodon 120 mg at bedtime.  4. Klonopin 1 mg 3 times.  5. Lantus 70 units subcutaneous bedtime.  6. Lidocaine 5% patch daily.  7. Lisinopril 10 mg daily.  8. Lovastatin 40 mg at bedtime.  9. NovoLog 10 units at breakfast, 6 units at lunch and 6 units at      supper coverage.  10.Tricor 145 mg daily.  11.Ultram 50 mg 3 times a day.   REVIEW OF SYSTEMS:  GENERAL:   No fever, chills, weight gain, weight  loss.  HEENT:  Unremarkable.  CARDIOVASCULAR:  Positive chest pain,  palpitations.  RESPIRATORY:  Positive for shortness of breath.  GASTROINTESTINAL:  Unremarkable.  GENITOURINARY:  Unremarkable.  MUSCULOSKELETAL:  Positive for foot pain.  Other systems unremarkable.   PHYSICAL EXAMINATION:  VITAL SIGNS:  Temperature is 98.5, pulse 73,  respirations 22, blood pressure 137/89, saturating 98% on room air.  GENERAL EXAMINATION:  He is well-nourished, well-hydrated, well-  developed in no acute distress.  HEENT:  Head is atraumatic, normocephalic.  No scleral icterus.  Oral  mucosa is moist.  NECK:  Soft, supple, nontender, nondistended.  No thyromegaly.  No JVD.  LUNGS:  Are clear auscultation.  No rales, rhonchi or wheezing.  CARDIOVASCULAR:  S1-S2 is regular, normal.  No murmurs, rubs or gallops.  ABDOMEN:  Is obese, soft.  Has tenderness in midepigastric  and left  sternal area on deep palpation.  No masses or hepatosplenomegaly.  Positive for bowel sounds.  EXTREMITIES:  No clubbing, cyanosis or edema.  The patient does have  some tenderness to his right foot on the plantar surface.  Old healed  wound noted.  NEUROLOGIC:  He is alert x3.  Cranial nerves II-XII are grossly intact.   LABORATORY DATA:  Last set of cardiac enzymes were CK-MB is less than 1,  troponin less than 0.05, myoglobin 29.7.  Sodium 138, potassium 3.7,  chloride 107, CO2 27, glucose 93, BUN 4, creatinine 0.85, D-dimer was  0.42.  White count 4.7, hemoglobin 12.8, hematocrit 37.4, platelet count  274,000.   RADIOLOGIC STUDIES:  CT angiogram of his chest showed:  1. No evidence of acute pulmonary embolism.  2. Small focus of __________ linear nodularity in the left lower lobe      has infectious or inflammatory pattern.   Chest x-ray showed the right PICC tip is difficult to visualize; was  probably in the SVC/RA junction; no pneumothorax.   ASSESSMENT:  This is a  45 year old Caucasian male who presents with  chest pain, substernal in nature, with some palpitations and shortness  of breath.  CT angiogram revealed no acute pulmonary embolus but did  reveal something suspicious for inflammatory or infectious process.   PLAN:  1. The patient admitted to the service of [facility] to a general      medical bed.  2. For his chest pain, we will get another set of cardiac enzymes.  So      far, his enzymes have been unremarkable.  The patient will be on IV      pain medications as well as aspirin.  The patient will be given      nitroglycerin as needed also.  I will get cardiology consult at      this time.  The patient may need stress testing in the near future      secondary to his family history.  3. For his probable pneumonia, the patient was placed on IV      antibiotics.  Will get sputum cultures and sensitivities as well as      blood cultures at this time.  Try to keep the patient's O2      saturation above greater than 90%.  4. For his insulin-dependent diabetes, the patient be placed on      sliding scale and will placed on his home on medications which      include Lantus.  5. For his of history of depression, the patient will be continued on      his home medications.  6. For his history of dyslipidemia and hypertension, he was also      placed on his home medications.  7. Lastly, the patient will be placed on DVT as well as GI      prophylaxis.   Anticipate the patient being discharged in next 24-48 hours after being  seen by cardiology due to no invasive procedures to be done.      Salem Caster, DO  Electronically Signed     SM/MEDQ  D:  11/15/2008  T:  11/15/2008  Job:  [id number]

## 2011-03-13 NOTE — Cardiovascular Report (Signed)
NAMEALDEAN, KILMER                ACCOUNT NO.:  [id number]   MEDICAL RECORD NO.:  [id number]          PATIENT TYPE:  INP   LOCATION:  2507                         FACILITY:  [facility]   PHYSICIAN:  [name], MDDATE OF BIRTH:  Nov 08, 1965   DATE OF PROCEDURE:  11/15/2008  DATE OF DISCHARGE:                            CARDIAC CATHETERIZATION   INDICATIONS:  Mr. Kaba is a very pleasant 45 year old male with 22-  year history of diabetes as well as hypertension, hyperlipidemia, and  obesity.  He was admitted with chest pain.  Cardiac markers have been  normal.  He has seen by Dr. Lattie Haw and referred for cardiac  catheterization due to suspicion of underlying coronary artery disease.   PROCEDURES PERFORMED:  1. Selective coronary angiography.  2. Left heart catheterization.  3. Left ventriculogram.  4. Star[employer] common femoral artery closure.   DESCRIPTION OF PROCEDURE:  The risks and indication were explained.  Consent was signed, placed on the chart.  A 5-French arterial sheath was  placed in the right femoral artery using a modified Seldinger technique.  Standard catheters including JL-4, JR-4, and angled pigtail were used  for procedure.  All catheter exchanges made over wire.  There were no  apparent complications.  Central aortic pressure was 144/91 with a mean  of 115.  LV pressure 153/15 with an EDP of 24.  There is no aortic  stenosis.   Left main was normal.   LAD was a large vessel wrapping the apex, gave off two diagonals.  There  were tandem 40% lesions in the mid section.   Left circumflex:  Gave off a large ramus and three OM branches.  There  is a 30% lesion in the mid section of the ramus and a 40% lesion in the  distal AV groove circumflex.   Right coronary artery was a large dominant vessel, gave off an RV  branch, PDA and two posterolaterals.  The second posterolateral was a  very large and covered most of the distal posterolateral wall.  It was  angiographically normal.   Left ventriculogram showed an EF of 65% with no regional wall motion  abnormalities.   ASSESSMENT:  1. Nonobstructive coronary artery disease.  2. Normal left ventricular function with elevated left ventricular end-      diastolic volume suggestive of diastolic dysfunction.   PLAN/DISCUSSION:  Given his angiography, I doubt his chest pain is  cardiac.  We will observe overnight and check a D-dimer.  If he remains  stable, will be discharged home in the morning.      [name], MD  Electronically Signed     DRB/MEDQ  D:  11/15/2008  T:  11/16/2008  Job:  [id number]

## 2011-03-13 NOTE — Group Therapy Note (Signed)
[name], [name]                ACCOUNT NO.:  [id number]   MEDICAL RECORD NO.:  [id number]          PATIENT TYPE:  INP   LOCATION:  A308                          FACILITY:  [facility]   PHYSICIAN:  Anselmo Pickler, DO    DATE OF BIRTH:  06/26/1966   DATE OF PROCEDURE:  DATE OF DISCHARGE:                                 PROGRESS NOTE   SUBJECTIVE:  The patient is saying that foot is still very painful.  He  is admitting pain with a foot pain medication.  Discussed with him plan  of care.  May set him up for home health care with a possible PICC line,  but we will discuss that with home health as well.  The patient has been  complaining of a sty of his left eye for the last 2-3 weeks.  It is also  very painful and bothering him.  We will start antibiotic therapy as  well.   PHYSICAL EXAMINATION:  VITAL SIGNS today:  Temperature 97.9, pulse 90,  respirations 18, blood pressure 121/65.  GENERAL:  The patient is well-developed, well-nourished in no acute  distress.  HEART:  Regular rate and rhythm.  LUNGS:  Clear to auscultation bilaterally.  ABDOMEN:  Soft, nontender and nondistended.  EXTREMITIES:  Foot has positive swelling with erythema.   LABORATORY DATA:  Labs for today are as follows:  White count 5.6,  hemoglobin 13.1, hematocrit 37.5, platelet count 124, sodium 137,  potassium 3.3, chloride 99, CO2 31, glucose 68, BUN 6, creatinine 0.80.   ASSESSMENT/PLAN:  1. Right foot cellulitis.  We will continue with antibiotic therapy.      Also we will set the patient up with a possible PICC line.  2. Hypokalemia.  We will replace with supplementation.  3. Sty of the left eye.  We will start Bleph-10 as directed.      Anselmo Pickler, DO  Electronically Signed     CB/MEDQ  D:  11/03/2008  T:  11/03/2008  Job:  [id number]

## 2011-03-13 NOTE — Discharge Summary (Signed)
[name], [name]                ACCOUNT NO.:  [id number]   MEDICAL RECORD NO.:  [id number]          PATIENT TYPE:  INP   LOCATION:  A310                          FACILITY:  [facility]   PHYSICIAN:  Bonnielee Haff, MD     DATE OF BIRTH:  Oct 18, 1966   DATE OF ADMISSION:  11/14/2008  DATE OF DISCHARGE:  01/18/2010LH                               DISCHARGE SUMMARY   Please review H and P dictated by Dr. Sherryle Lis for details of the  patient's presenting illness.   DISCHARGE DIAGNOSES:  1. Chest pain in the setting of multiple risk factors.  2. Recent admission for cellulitis.  3. Diabetes on insulin.  4. Depression.  5. Dyslipidemia.   This is a 45 year old Caucasian male who was initially admitted from  November 01, 2008, and I believe he was discharged on January 7 or 8, 2010  after being treated for cellulitis.  He was discharged on IV  antibiotics.  The patient presented to the ED with complaints of chest  pain.  He was having difficulty with his  PICC line that was not  flushing, and then when he tried to push through the IV, he experienced  chest pain shortly thereafter, so he came into the ED.  A CT was done,  which was negative for PE.  He was found to have a possible pneumonia.  The patient was started on antibiotics.  The patient was seen by [facility]  Cardiology, and they feel that, considering his multiple risk factors,  he needs a cardiac catheterization, so they transferred him to [facility] today.  I could not see the patient today before he was  transferred.   His final discharge instructions, medications, etc., will be dictated  when the patient is sent home from [facility]      Bonnielee Haff, MD  Electronically Signed     GK/MEDQ  D:  11/15/2008  T:  11/15/2008  Job:  [id number]

## 2011-03-13 NOTE — Consult Note (Signed)
NAMEJW, VELASCO                ACCOUNT NO.:  [id number]   MEDICAL RECORD NO.:  [id number]          PATIENT TYPE:  INP   LOCATION:  A310                          FACILITY:  [facility]   PHYSICIAN:  [name], MD, FACCDATE OF BIRTH:  1966/10/08   DATE OF CONSULTATION:  DATE OF DISCHARGE:  11/15/2008                                 CONSULTATION   REFERRING PHYSICIAN:  Dr. Sherryle Lis.   PRIMARY CARE PHYSICIAN:  [facility].   PRIMARY ENDOCRINOLOGIST:  [facility] and also followed at [facility] for peripheral neuropathy.   HISTORY OF PRESENT ILLNESS:  A 45 year old gentleman with a 12-year  history of diabetes, presenting with chest discomfort and dyspnea.   Mr. Jett was recently at [facility] due to a puncture wound  in the right foot with a surrounding cellulitis.  This has improved  substantially with intravenous antibiotics, which he continues to  receive via PICC line in his right arm.  Over the past 43 days, he has  noted intermittent left chest discomfort approximately at the level of  the fifth intercostal space with both sharp and pressure-like  components.  This is unrelated to exertion, body position, or time of  day.  There is a modest pleuritic component.  He has associated dyspnea  and diaphoresis without nausea.  There is radiation to the left arm with  numbness in the left arm.  The pain is moderate intensity.  Episodes  last minutes to hours.  He is given sublingual nitroglycerin in the  emergency department with some apparent benefit, but a subsequent dose  for recurrent spell has been in effective.  He has not had chest  discomfort in the past.  He has never seen a cardiologist nor undergone  any significant cardiac testing.   PAST MEDICAL HISTORY:  Otherwise notable for hyperlipidemia,  hypertension, and depression.  He has undergone two outpatient surgeries  for resection of a subcutaneous cyst from his right back.   CURRENT MEDICATIONS:  1. Lortab 20/625 mg t.i.d.  2. Cymbalta 120 mg at bedtime.  3. Geodon 120 mg at bedtime.  4. Klonopin 1 mg t.i.d.  5. Lantus insulin 70 units subcu at bedtime.  6. Lidocaine patch daily.  7. Lisinopril 10 mg daily.  8. Lovastatin 40 mg daily.  9. NovoLog 10 units at breakfast, 6 units at lunch, 6 units at supper.  10.Tricor 145 mg daily.  11.Ultram 50 mg t.i.d.   ALLERGIES:  Medical allergies are reported to METFORMIN and PERCOCET.   SOCIAL HISTORY:  No use of tobacco products nor alcohol.  He resides at  [facility] with his family.  Currently unemployed and receiving  disability.   FAMILY HISTORY:  Positive for diabetes, cardiac disease, and stroke.   REVIEW OF SYSTEMS:  Generalized weakness, easy fatigability; history of  minor asthmatic symptoms; problems with diabetic diet at home;  previously diagnosed with GERD.  He has a history of depression and  anxiety treated by a psychiatrist.  All other systems reviewed and are  negative.   PHYSICAL EXAMINATION:  GENERAL:  Overweight gentleman with a fairly flat  affect, complaining of chest discomfort, but appeared to be in no acute  distress.  VITAL SIGNS:  The temperature is 98.4,  heart rate 67 and regular,  respirations 24, blood pressure 160/96, O2 saturation 97% on room air.  Weight is 126 kg, height 74 inches.  HEENT:  Pupils equal, round, and react to light; normal lids and  conjunctiva; normal oral mucosa.  NECK:  No jugular venous distention; normal carotid upstrokes without  bruits.  ENDOCRINE:  No thyromegaly.  HEMATOPOIETIC:  No adenopathy.  SKIN:  No significant lesions.  LUNGS:  Few basilar rales but clear with cough.  CARDIAC:  Normal first and second heart sounds; fourth heart sound  present.  ABDOMEN:  Soft and nontender; normal bowel sounds; no masses; no  organomegaly.  EXTREMITIES:  Slight edema on the right; puncture wound over the second  metatarsal head that appears to be  resolving; no erythema or warmth.  Distal pulses intact.  NEUROLOGIC:  Symmetric strength and tone; decreased sensation in the  lower extremities.  Normal cranial nerves.   EKG during chest pain today:  Normal sinus rhythm; minimal J-point  elevation in lead I; shallow T-wave inversion in lead V2.  No prior  tracing for comparison.  Tracings from earlier hospitalizations are not  currently available for review.   Chest x-ray:  Cardiomegaly; otherwise unremarkable.  CT scan of the chest:  Increased density in the left lower lung field  that is vaguely described.   Other laboratory notable for normal CBC, normal D-dimer, CBG is 177 and  206, normal chemistry profile and negative cardiac markers.   IMPRESSION:  Mr. Wiederholt presents with chest discomfort in a worrisome  pattern with a description c/w ischemic origin but negative EKG during  his symptoms, negative cardiac markers, and an unremarkable physical  examination.  Due to his multiple cardiovascular risk factors, he will  likely require coronary angiography to definitively rule out ischemic  heart disease.  I will obtain his prior EKGs and review those as well as  review his CT scan with the radiologist looking for coronary  calcification.  We will attempt to relieve his symptoms with  sublingual and intravenous nitroglycerin.  He will be given aspirin,  intravenous heparin, and clopidogrel will be considered if his symptoms  become more worrisome.  We appreciate the request for consultation and  we will be happy to follow this nice gentleman with you.      [name], MD, [facility]  Electronically Signed     RMR/MEDQ  D:  11/15/2008  T:  11/15/2008  Job:  [id number]

## 2011-03-13 NOTE — Discharge Summary (Signed)
[name], [name]                ACCOUNT NO.:  [id number]   MEDICAL RECORD NO.:  [id number]          PATIENT TYPE:  INP   LOCATION:  2507                         FACILITY:  [facility]   PHYSICIAN:  [name], MDDATE OF BIRTH:  December 28, 1965   DATE OF ADMISSION:  11/15/2008  DATE OF DISCHARGE:  11/16/2008                               DISCHARGE SUMMARY   PRIMARY CARDIOLOGIST:  [name], MD, [facility]   PRIMARY CARE [name]:  [facility].   DISCHARGE DIAGNOSIS:  Chest pain.   SECONDARY DIAGNOSES:  1. Questionable community-acquired pneumonia.  2. Recent cellulitis of the right foot, treated with intravenous      vancomycin via peripherally inserted central catheter line on an      outpatient basis.  3. 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  4. Hypertension.  5. Hyperlipidemia.  6. Depression.  7. Chronic pain.   ALLERGIES:  METFORMIN and PERCOCET.   PROCEDURES:  Left heart cardiac catheterization performed on November 15, 2008, showing nonobstructive coronary artery disease, normal left  ventricular function, ejection fraction of 65%.   HISTORY OF PRESENT ILLNESS:  This is a 45 year old Caucasian male  without prior cardiac history.  He was recently admitted to [facility] in early January with right foot cellulitis and was  subsequently discharged home on IV vancomycin via PICC line.  On November 14, 2008, the patient developed chest pain after flushing of his PICC  line and subsequently presented to the [facility] ED.  There, he was  noted to have an elevated D-dimer and a CT of the chest was performed  and showed no PE, but there was question of left lower lobe pneumonia.  The patient was subsequently started on intravenous Levaquin.  He ruled  out for MI and was evaluated by Dr. Lattie Haw of [facility], and  given his risk factors, decision was made to pursue cardiac  catheterization.  The patient was then  transferred to [facility] on  November 15, 2008.   HOSPITAL COURSE:  The patient underwent left heart cardiac  catheterization on November 05, 2008, revealing nonobstructive coronary  artery disease, normal LV function, EF of 65%.  He has had no recurrent  chest pain and has been ambulating without difficulty.  We have titrated  his ACE inhibitor to lisinopril 20 mg daily for better blood pressure  control.  Otherwise, he will be discharged home today in good condition.   DISCHARGE LABORATORY DATA:  Hemoglobin 12.3, hematocrit 35.2, WBC 3.6,  platelets 183.  Sodium 142, potassium 4.0, chloride 108, CO2 25, BUN 5,  creatinine 0.72, glucose 140, calcium 8.6.  CK 48, MB 0.7, troponin I  less than 0.01.  Total cholesterol 170, triglycerides 189, HDL 37, LDL  95.  Blood cultures from [facility] on November 15, 2008, showed no  growth x2.   DISPOSITION:  The patient will be discharged home today in good  condition.   FOLLOWUP PLANS AND APPOINTMENTS:  We have asked him to follow up with  his primary care [name] at [facility] in 1-2 weeks.   DISCHARGE MEDICATIONS:  1. Lortab 10/325 mg 2 tablets t.i.d.  2. Cymbalta 120 mg nightly.  3. Geodon 120 mg nightly.  4. Klonopin 1 mg t.i.d.  5. Lantus 70 units subcu nightly.  6. Lidoderm 5% patch daily.  7. Lisinopril 20 mg daily.  8. Lovastatin 40 mg nightly.  9. NovoLog 10 units with breakfast, 6 units with lunch, 6 units with      supper.  10.TriCor 145 mg daily.  11.Levaquin 500 mg daily x5 additional days.  12.Ultram 50 mg t.i.d.   OUTSTANDING LABORATORY DATA AND STUDIES:  None.   DURATION OF DISCHARGE ENCOUNTER:  35 minutes including physician time.      [name], ANP      [name], MD  Electronically Signed    CB/MEDQ  D:  11/16/2008  T:  11/16/2008  Job:  [id number]   cc:   [facility]

## 2011-03-13 NOTE — Group Therapy Note (Signed)
[name], [name]                ACCOUNT NO.:  [id number]   MEDICAL RECORD NO.:  [id number]          PATIENT TYPE:  INP   LOCATION:  A308                          FACILITY:  [facility]   PHYSICIAN:  Bonnielee Haff, MD     DATE OF BIRTH:  1966-04-08   DATE OF PROCEDURE:  11/02/2008  DATE OF DISCHARGE:                                 PROGRESS NOTE   OBJECTIVE:  The patient is feeling slightly better.  He is feeling a  little bit drowsy because of his narcotic.  Pain in the legs still  persists at 8/10 in intensity.  He thinks that the leg looks better in  appearance.   OBJECTIVE:  He is afebrile, 97.9, heart rate 90, respiratory rate 16,  blood pressure 115/77, saturation 97% on room air.  LUNGS:  Clear to auscultation.  CARDIAC:  Unremarkable.  EXTREMITIES:  Right lower extremity shows improvement in erythema and  swelling.  Pain still persists.  Peripheral pulses are present.   LABORATORY DATA:  His white count is normal today.  Hemoglobin is  stable.  CBGs running 192, 149.  Sodium is 134.  HbA1c at 7.1.   ASSESSMENT/PLAN:  1. Right lower extremity cellulitis.  Continue Unasyn day #2.  He is      showing improvement.  It would probably take at least 2 more days      for him to significantly improve.  2. Insulin-dependent diabetes.  Continue with current diabetes      regimen.  He is on Lantus insulin.  He is also on Januvia  as well      as on Amaryl and also on Avandia.  3. Hypertension.  Continue with current antihypertensive agents.  4. Dyslipidemia,  5. He is on deep venous thrombosis prophylaxis.   So we await improvement in his cellulitis, and I am hoping in the next  couple of days he should be able to go home.      Bonnielee Haff, MD  Electronically Signed     GK/MEDQ  D:  11/02/2008  T:  11/02/2008  Job:  [id number]   cc:   [facility]

## 2011-03-13 NOTE — H&P (Signed)
[name], [name] NO.:  [id number]   MEDICAL RECORD NO.:  [id number]          PATIENT TYPE:  EMS   LOCATION:  ED                            FACILITY:  [facility]   PHYSICIAN:  Bonnielee Haff, MD     DATE OF BIRTH:  1966/01/11   DATE OF ADMISSION:  11/01/2008  DATE OF DISCHARGE:  LH                              HISTORY & PHYSICAL   Patient's PMD is [facility].  He also is followed by  a psychiatrist, by the [facility] at [facility], and also by an  endocrinologist at the [facility] in [place], [place].   ADMITTING DIAGNOSES:  1. Right lower extremity cellulitis.  2. Insulin-dependent diabetes.  3. Dyslipidemia.  4. Peripheral neuropathy.  5. Depression.   CHIEF COMPLAINT:  Right foot pain.   HISTORY OF PRESENT ILLNESS:  The patient is a 45 year old white male who  has diabetes for the last 12 years with severe peripheral neuropathy  affecting his upper as well as lower extremities, who was in his usual  state of health when he started noticing pain in his foot last night and  this morning.  Then he noticed some blood on the floor when he was  walking, so he checked his foot and noticed that he had a wound on the  sole of his foot on the right side.  The patient went to his boots and  saw that there was a nail that was lying inside his boots, so he  apparently stepped on this nail sometime yesterday and did not realize.  He experienced some chills but denies any fever.  His foot has been  getting warmer and more tender as hours have gone by.  The pain was  10/10 in intensity, which was a dull aching pain, decreasing to 4/10 in  intensity after pain medications.  Denies any nausea or vomiting,  diarrhea.  Denies any previous episodes of cellulitis.  He also admits  to swelling in his right foot.   MEDICATIONS AT HOME:  1. He is on Abilify 2 mg daily.  2. Avandia 8 mg once daily.  3. Cymbalta 120 mg at bedtime.  4. Geodon 120 mg at  bedtime.  5. Klonopin 1 mg t.i.d.  6. Lantus 70 units at bedtime.  7. Lidoderm patch 5% to both his feet, he applies for 8 hours everyday      from 12 p.m. to 6 a.m.  8. Lisinopril 10 mg once daily .  9. Lovastatin 40 mg once daily.  10.NovoLog on a sliding scale.  11.Januvia 100 mg daily .  12.TriCor 145 mg daily.  13.Ultram 50 mg 3 times daily.  14.He is also on glimepiride 4 mg daily.  15.He was taken off of Topamax recently.   ALLERGIES:  Include METFORMIN which causes nausea and PERCOCET which  causes hives.   PAST MEDICAL HISTORY:  1. Positive for diabetes for the past 12 years with history of severe      peripheral neuropathy affecting his upper and lower extremities.  2. History of dyslipidemia.  3. Hypertension.  4. Depression.  5. Cyst removal from the back.   SOCIAL HISTORY:  Lives in [place] with his family.  No smoking,  alcohol or illicit drug use.  He is currently on disability.   FAMILY HISTORY:  Positive for diabetes, heart disease, and stroke.   REVIEW OF SYSTEMS:  GENERAL REVIEW OF SYSTEMS:  Positive for weakness.  HEENT:  Unremarkable.  CARDIOVASCULAR:  Unremarkable.  RESPIRATORY:  Unremarkable.  GI:  Unremarkable.  GU:  Unremarkable.  MUSCULOSKELETAL:  As in HPI.  NEUROLOGICAL:  As in HPI.  PSYCHIATRIC:  Unremarkable.  DERMATOLOGIC:  As in HPI.   PHYSICAL EXAMINATION:  VITAL SIGNS:  Temperature 97.8, blood pressure  153/101 and then dropping down to 128/70, heart rate 106, respiratory  rate 20, saturation 98% on room air.  GENERAL EXAM:  Overweight white male in no distress.  HEENT:  There is no pallor, no icterus.  Oral mucus membrane is moist.  No oral lesions are noted.  NECK:  Soft and supple, no thyromegaly is appreciated.  LUNGS:  Clear to auscultation bilaterally and no wheezing, rales or  rhonchi.  CARDIOVASCULAR:  S1, S2 are normal, regular, no murmurs appreciated.  No  S3-S4, no rubs, no bruits.  ABDOMEN:  Soft, nontender,  nondistended, bowel sounds are present.  No  masses or organomegaly is appreciated.  MUSCULOSKELETAL EXAM:  Unremarkable.  NEUROLOGICALLY:  He is alert, oriented x3, no focal neurologic deficits  are present.  Examination of the right lower extremity reveals swollen  foot, especially the first 3 toes.  He has got a puncture wound on the  plantar aspect of that foot near the toes and the area is tender to  palpation.  It is swollen, erythematous, the erythema extends up to  about mid leg.  The foot and the leg are also warm to touch.   LAB DATA:  His white count is normal at 9.0, neutrophils 78%, hemoglobin  is 15.1, MCV is 92, platelet count is 225.  Sodium is 133, his glucose  is 197, renal function is normal.  He had a foot x-ray which did not  show any acute findings.   ASSESSMENT:  This is a 45 year old Caucasian male with diabetes who  stepped on a nail yesterday and did not realize until he experienced  pain this morning and now has evidence for right lower extremity  cellulitis.  Because he is a diabetic and because the spread of  infection was rather rapid, I think he merits inpatient admission for IV  antibiotics.   1. Right lower extremity cellulitis secondary to trauma:  We will put      him on Unasyn 3 g every 6 hours, give him pain medications.  I will      check his tetanus status as that vaccination may have to be given      if he has not received it recently.  2. Insulin-dependent diabetes:  We will put him on a sliding scale,      continue with his Lantus,  Avandia and Januvia.  Check HbA1c as      well.  3. Severe peripheral neuropathy:  The patient might benefit from being      on Neurontin, however, I would defer this to his PMD .  We will      continue with the Lidoderm patches for his Dupuytren contractures.  4. Depression:  Continue with Cymbalta, Geodon and Abilify.  5. Deep venous thrombophlebitis:  Will utilize.   Further management  decisions will depend  on the results of further  testing and the patient's response to treatment.  Blood cultures will  also be obtained.   Anticipate patient staying in the hospital about 2-3 days.      Bonnielee Haff, MD  Electronically Signed     GK/MEDQ  D:  11/01/2008  T:  11/01/2008  Job:  [id number]   cc:   [facility]

## 2011-03-16 NOTE — Discharge Summary (Signed)
NAMEMESIAH, MACAK                ACCOUNT NO.:  [id number]   MEDICAL RECORD NO.:  [id number]          PATIENT TYPE:  INP   LOCATION:  A308                          FACILITY:  [facility]   PHYSICIAN:  Anselmo Pickler, DO    DATE OF BIRTH:  [birth year]   DATE OF ADMISSION:  11/01/2008  DATE OF DISCHARGE:  01/07/2010LH                               DISCHARGE SUMMARY   ADMISSION DIAGNOSES:  1. Right lower extremity cellulitis.  2. Insulin-dependent diabetes.  3. Dyslipidemia.  4. Peripheral neuropathy.  5. Depression.   DISCHARGE DIAGNOSES:  1. Right lower extremity cellulitis.  2. Insulin-dependent diabetes.  3. Dyslipidemia.  4. Peripheral neuropathy.  5. Depression.   His H and P was done by Dr. Maryland Pink, please refer for history of  present illness.   HOSPITAL COURSE:  The patient was admitted for the above diagnoses.  He  was started on IV antibiotics.  It was determined that he was getting  better and the patient was getting anxious to go home so a PICC line was  placed and home health care was initiated for the patient to be able to  receive services at home.  He would be placed on vancomycin for the next  week.  Also he was seen by diabetes management while he was here.   His medications that he was sent home on include:  1. Avandia 8 mg p.o. daily.  2. Januvia 100 mg p.o. daily.  3. Lisinopril 10 mg p.o. daily.  4. Lovastatin 40 mg p.o. daily.  5. Tramadol 50 mg 3 times a day.  6. Tricor 145 mg p.o. daily.  7. NovoLog sliding scale, specialized dosing.  8. Lantus, no dose given.  9. Klonopin 1 mg 3 times daily.  10.Geodon  60 mg 2 capsules daily.  11.Cymbalta 60 mg at bedtime.  12.Lidoderm patch no dose given.  13.New medication he was given is Vicodin HP 1 p.o. q.6 h p.r.n. #20.   DISCHARGE INSTRUCTIONS:  Was to follow up with [facility].  To stay on an ADA diet.  Home health care was set up for him, and to  increase his activity slowly.  He was set up  for an appointment for that  following Friday or Monday and his IV vancomycin was set up for home  health for 7 days after discharge.      Anselmo Pickler, DO  Electronically Signed     CB/MEDQ  D:  11/24/2008  T:  11/24/2008  Job:  [id number]

## 2011-06-20 ENCOUNTER — Encounter: Payer: Self-pay | Admitting: Cardiothoracic Surgery

## 2011-06-20 ENCOUNTER — Encounter: Payer: Self-pay | Admitting: Nurse Practitioner

## 2011-06-30 ENCOUNTER — Encounter: Payer: Self-pay | Admitting: Nurse Practitioner

## 2011-06-30 ENCOUNTER — Encounter: Payer: Self-pay | Admitting: Cardiothoracic Surgery

## 2011-07-30 ENCOUNTER — Encounter: Payer: Self-pay | Admitting: Cardiothoracic Surgery

## 2011-07-30 ENCOUNTER — Encounter: Payer: Self-pay | Admitting: Nurse Practitioner

## 2011-08-30 ENCOUNTER — Encounter: Payer: Self-pay | Admitting: Nurse Practitioner

## 2011-08-30 ENCOUNTER — Encounter: Payer: Self-pay | Admitting: Cardiothoracic Surgery

## 2012-03-31 ENCOUNTER — Encounter (HOSPITAL_COMMUNITY): Payer: Self-pay | Admitting: Emergency Medicine

## 2012-03-31 ENCOUNTER — Emergency Department (HOSPITAL_COMMUNITY)
Admission: EM | Admit: 2012-03-31 | Discharge: 2012-03-31 | Discharge status: Against Medical Advice | Payer: Medicaid Other | Attending: Emergency Medicine | Admitting: Emergency Medicine

## 2012-03-31 DIAGNOSIS — M549 Dorsalgia, unspecified: Secondary | ICD-10-CM | POA: Insufficient documentation

## 2012-03-31 HISTORY — DX: Hyperlipidemia, unspecified: E78.5

## 2012-03-31 HISTORY — DX: Other chronic pain: G89.29

## 2012-03-31 HISTORY — DX: Essential (primary) hypertension: I10

## 2012-03-31 NOTE — ED Notes (Signed)
Pain in back shooting down right leg. Denies injury

## 2012-03-31 NOTE — ED Notes (Signed)
Pt is patient at pain clinic in [place]. Pt states has pain in neck/back/legs chronic

## 2012-10-29 HISTORY — PX: AMPUTATION TOE: SHX6595

## 2016-06-24 ENCOUNTER — Encounter: Payer: Self-pay | Admitting: Emergency Medicine

## 2016-06-24 DIAGNOSIS — Z79899 Other long term (current) drug therapy: Secondary | ICD-10-CM

## 2016-06-24 DIAGNOSIS — N183 Chronic kidney disease, stage 3 (moderate): Secondary | ICD-10-CM | POA: Diagnosis present

## 2016-06-24 DIAGNOSIS — B9561 Methicillin susceptible Staphylococcus aureus infection as the cause of diseases classified elsewhere: Secondary | ICD-10-CM | POA: Diagnosis present

## 2016-06-24 DIAGNOSIS — N170 Acute kidney failure with tubular necrosis: Principal | ICD-10-CM | POA: Diagnosis present

## 2016-06-24 DIAGNOSIS — E1140 Type 2 diabetes mellitus with diabetic neuropathy, unspecified: Secondary | ICD-10-CM | POA: Diagnosis present

## 2016-06-24 DIAGNOSIS — E7800 Pure hypercholesterolemia, unspecified: Secondary | ICD-10-CM | POA: Diagnosis present

## 2016-06-24 DIAGNOSIS — L97519 Non-pressure chronic ulcer of other part of right foot with unspecified severity: Secondary | ICD-10-CM | POA: Diagnosis present

## 2016-06-24 DIAGNOSIS — M86671 Other chronic osteomyelitis, right ankle and foot: Secondary | ICD-10-CM | POA: Diagnosis present

## 2016-06-24 DIAGNOSIS — Z881 Allergy status to other antibiotic agents status: Secondary | ICD-10-CM

## 2016-06-24 DIAGNOSIS — E878 Other disorders of electrolyte and fluid balance, not elsewhere classified: Secondary | ICD-10-CM | POA: Diagnosis present

## 2016-06-24 DIAGNOSIS — R45851 Suicidal ideations: Secondary | ICD-10-CM | POA: Diagnosis present

## 2016-06-24 DIAGNOSIS — E11621 Type 2 diabetes mellitus with foot ulcer: Secondary | ICD-10-CM | POA: Diagnosis present

## 2016-06-24 DIAGNOSIS — Z6837 Body mass index (BMI) 37.0-37.9, adult: Secondary | ICD-10-CM

## 2016-06-24 DIAGNOSIS — E1122 Type 2 diabetes mellitus with diabetic chronic kidney disease: Secondary | ICD-10-CM | POA: Diagnosis present

## 2016-06-24 DIAGNOSIS — I129 Hypertensive chronic kidney disease with stage 1 through stage 4 chronic kidney disease, or unspecified chronic kidney disease: Secondary | ICD-10-CM | POA: Diagnosis present

## 2016-06-24 DIAGNOSIS — E1169 Type 2 diabetes mellitus with other specified complication: Secondary | ICD-10-CM | POA: Diagnosis present

## 2016-06-24 DIAGNOSIS — Z888 Allergy status to other drugs, medicaments and biological substances status: Secondary | ICD-10-CM

## 2016-06-24 DIAGNOSIS — Z794 Long term (current) use of insulin: Secondary | ICD-10-CM

## 2016-06-24 DIAGNOSIS — Z89422 Acquired absence of other left toe(s): Secondary | ICD-10-CM

## 2016-06-24 DIAGNOSIS — Z885 Allergy status to narcotic agent status: Secondary | ICD-10-CM

## 2016-06-24 DIAGNOSIS — E669 Obesity, unspecified: Secondary | ICD-10-CM | POA: Diagnosis present

## 2016-06-24 DIAGNOSIS — F250 Schizoaffective disorder, bipolar type: Secondary | ICD-10-CM | POA: Diagnosis present

## 2016-06-24 DIAGNOSIS — Z7901 Long term (current) use of anticoagulants: Secondary | ICD-10-CM

## 2016-06-24 DIAGNOSIS — E1165 Type 2 diabetes mellitus with hyperglycemia: Secondary | ICD-10-CM | POA: Diagnosis present

## 2016-06-24 DIAGNOSIS — B951 Streptococcus, group B, as the cause of diseases classified elsewhere: Secondary | ICD-10-CM | POA: Diagnosis present

## 2016-06-24 DIAGNOSIS — E860 Dehydration: Secondary | ICD-10-CM | POA: Diagnosis present

## 2016-06-24 DIAGNOSIS — Z86718 Personal history of other venous thrombosis and embolism: Secondary | ICD-10-CM

## 2016-06-24 LAB — COMPREHENSIVE METABOLIC PANEL
ALK PHOS: 111 U/L (ref 38–126)
ALT: 13 U/L — AB (ref 17–63)
ANION GAP: 12 (ref 5–15)
AST: 22 U/L (ref 15–41)
Albumin: 3.1 g/dL — ABNORMAL LOW (ref 3.5–5.0)
BILIRUBIN TOTAL: 0.4 mg/dL (ref 0.3–1.2)
BUN: 28 mg/dL — ABNORMAL HIGH (ref 6–20)
CALCIUM: 9.2 mg/dL (ref 8.9–10.3)
CO2: 22 mmol/L (ref 22–32)
CREATININE: 2.75 mg/dL — AB (ref 0.61–1.24)
Chloride: 98 mmol/L — ABNORMAL LOW (ref 101–111)
GFR calc non Af Amer: 26 mL/min — ABNORMAL LOW (ref >60)
GFR, EST AFRICAN AMERICAN: 30 mL/min — AB (ref >60)
Glucose, Bld: 311 mg/dL — ABNORMAL HIGH (ref 65–99)
Potassium: 3.2 mmol/L — ABNORMAL LOW (ref 3.5–5.1)
Sodium: 132 mmol/L — ABNORMAL LOW (ref 135–145)
TOTAL PROTEIN: 6.8 g/dL (ref 6.5–8.1)

## 2016-06-24 LAB — ETHANOL: Alcohol, Ethyl (B): <5 mg/dL (ref <5)

## 2016-06-24 LAB — CBC
HEMATOCRIT: 40.1 % (ref 40.0–52.0)
Hemoglobin: 14.3 g/dL (ref 13.0–18.0)
MCH: 30.8 pg (ref 26.0–34.0)
MCHC: 35.6 g/dL (ref 32.0–36.0)
MCV: 86.3 fL (ref 80.0–100.0)
Platelets: 282 10*3/uL (ref 150–440)
RBC: 4.65 MIL/uL (ref 4.40–5.90)
RDW: 13.1 % (ref 11.5–14.5)
WBC: 7.3 10*3/uL (ref 3.8–10.6)

## 2016-06-24 LAB — SALICYLATE LEVEL

## 2016-06-24 LAB — ACETAMINOPHEN LEVEL

## 2016-06-24 NOTE — ED Triage Notes (Signed)
Pt states he has felt suicidal and it has been increasing over last 3 weeks. Pt states he feels like he is actively going to hurt himself. Pt will have a staff member with him at all times in triage. Pt denies any ingestions or attempts to harm self, but states has made a plan to harm himself.

## 2016-06-24 NOTE — ED Notes (Signed)
juanetta sitting with pt within one arm's length for suicidal precautions.

## 2016-06-25 ENCOUNTER — Inpatient Hospital Stay: Payer: Medicaid Other

## 2016-06-25 ENCOUNTER — Encounter: Payer: Self-pay | Admitting: Emergency Medicine

## 2016-06-25 ENCOUNTER — Emergency Department: Payer: Medicaid Other

## 2016-06-25 ENCOUNTER — Inpatient Hospital Stay
Admission: EM | Admit: 2016-06-25 | Discharge: 2016-06-26 | DRG: 683 | Disposition: A | Discharge status: Other Acute Inpatient Hospital | Payer: Medicaid Other | Attending: Internal Medicine | Admitting: Internal Medicine

## 2016-06-25 DIAGNOSIS — R45851 Suicidal ideations: Secondary | ICD-10-CM

## 2016-06-25 DIAGNOSIS — Z86718 Personal history of other venous thrombosis and embolism: Secondary | ICD-10-CM | POA: Diagnosis not present

## 2016-06-25 DIAGNOSIS — F203 Undifferentiated schizophrenia: Secondary | ICD-10-CM

## 2016-06-25 DIAGNOSIS — Z046 Encounter for general psychiatric examination, requested by authority: Secondary | ICD-10-CM

## 2016-06-25 DIAGNOSIS — L97519 Non-pressure chronic ulcer of other part of right foot with unspecified severity: Secondary | ICD-10-CM | POA: Diagnosis present

## 2016-06-25 DIAGNOSIS — B9561 Methicillin susceptible Staphylococcus aureus infection as the cause of diseases classified elsewhere: Secondary | ICD-10-CM | POA: Diagnosis present

## 2016-06-25 DIAGNOSIS — E1169 Type 2 diabetes mellitus with other specified complication: Secondary | ICD-10-CM | POA: Diagnosis present

## 2016-06-25 DIAGNOSIS — Z888 Allergy status to other drugs, medicaments and biological substances status: Secondary | ICD-10-CM | POA: Diagnosis not present

## 2016-06-25 DIAGNOSIS — F250 Schizoaffective disorder, bipolar type: Secondary | ICD-10-CM

## 2016-06-25 DIAGNOSIS — M86671 Other chronic osteomyelitis, right ankle and foot: Secondary | ICD-10-CM | POA: Diagnosis present

## 2016-06-25 DIAGNOSIS — E11621 Type 2 diabetes mellitus with foot ulcer: Secondary | ICD-10-CM | POA: Diagnosis present

## 2016-06-25 DIAGNOSIS — N183 Chronic kidney disease, stage 3 (moderate): Secondary | ICD-10-CM | POA: Diagnosis present

## 2016-06-25 DIAGNOSIS — E878 Other disorders of electrolyte and fluid balance, not elsewhere classified: Secondary | ICD-10-CM | POA: Diagnosis present

## 2016-06-25 DIAGNOSIS — E1140 Type 2 diabetes mellitus with diabetic neuropathy, unspecified: Secondary | ICD-10-CM | POA: Diagnosis present

## 2016-06-25 DIAGNOSIS — N179 Acute kidney failure, unspecified: Secondary | ICD-10-CM | POA: Diagnosis present

## 2016-06-25 DIAGNOSIS — Z885 Allergy status to narcotic agent status: Secondary | ICD-10-CM | POA: Diagnosis not present

## 2016-06-25 DIAGNOSIS — M869 Osteomyelitis, unspecified: Secondary | ICD-10-CM

## 2016-06-25 DIAGNOSIS — E7800 Pure hypercholesterolemia, unspecified: Secondary | ICD-10-CM | POA: Diagnosis present

## 2016-06-25 DIAGNOSIS — I129 Hypertensive chronic kidney disease with stage 1 through stage 4 chronic kidney disease, or unspecified chronic kidney disease: Secondary | ICD-10-CM | POA: Diagnosis present

## 2016-06-25 DIAGNOSIS — E1122 Type 2 diabetes mellitus with diabetic chronic kidney disease: Secondary | ICD-10-CM | POA: Diagnosis present

## 2016-06-25 DIAGNOSIS — E669 Obesity, unspecified: Secondary | ICD-10-CM | POA: Diagnosis present

## 2016-06-25 DIAGNOSIS — Z881 Allergy status to other antibiotic agents status: Secondary | ICD-10-CM | POA: Diagnosis not present

## 2016-06-25 DIAGNOSIS — F32A Depression, unspecified: Secondary | ICD-10-CM

## 2016-06-25 DIAGNOSIS — Z794 Long term (current) use of insulin: Secondary | ICD-10-CM | POA: Diagnosis not present

## 2016-06-25 DIAGNOSIS — N170 Acute kidney failure with tubular necrosis: Secondary | ICD-10-CM | POA: Diagnosis present

## 2016-06-25 DIAGNOSIS — B951 Streptococcus, group B, as the cause of diseases classified elsewhere: Secondary | ICD-10-CM | POA: Diagnosis present

## 2016-06-25 DIAGNOSIS — F200 Paranoid schizophrenia: Secondary | ICD-10-CM

## 2016-06-25 DIAGNOSIS — Z6837 Body mass index (BMI) 37.0-37.9, adult: Secondary | ICD-10-CM | POA: Diagnosis not present

## 2016-06-25 DIAGNOSIS — F329 Major depressive disorder, single episode, unspecified: Secondary | ICD-10-CM

## 2016-06-25 DIAGNOSIS — E1165 Type 2 diabetes mellitus with hyperglycemia: Secondary | ICD-10-CM | POA: Diagnosis present

## 2016-06-25 DIAGNOSIS — N189 Chronic kidney disease, unspecified: Secondary | ICD-10-CM | POA: Diagnosis present

## 2016-06-25 DIAGNOSIS — E860 Dehydration: Secondary | ICD-10-CM | POA: Diagnosis present

## 2016-06-25 HISTORY — DX: Chronic kidney disease, stage 3 unspecified: N18.30

## 2016-06-25 HISTORY — DX: Schizophrenia, unspecified: F20.9

## 2016-06-25 HISTORY — DX: Chronic kidney disease, stage 3 (moderate): N18.3

## 2016-06-25 LAB — GLUCOSE, CAPILLARY
Glucose-Capillary: 378 mg/dL — ABNORMAL HIGH (ref 65–99)
Glucose-Capillary: 329 mg/dL — ABNORMAL HIGH (ref 65–99)
Glucose-Capillary: 331 mg/dL — ABNORMAL HIGH (ref 65–99)
Glucose-Capillary: 217 mg/dL — ABNORMAL HIGH (ref 65–99)

## 2016-06-25 LAB — URINALYSIS COMPLETE WITH MICROSCOPIC (ARMC ONLY)
Bilirubin Urine: NEGATIVE
Glucose, UA: NEGATIVE mg/dL
Hgb urine dipstick: NEGATIVE
KETONES UR: NEGATIVE mg/dL
LEUKOCYTES UA: NEGATIVE
Nitrite: NEGATIVE
PH: 5 (ref 5.0–8.0)
PROTEIN: NEGATIVE mg/dL
Specific Gravity, Urine: 1.029 (ref 1.005–1.030)

## 2016-06-25 LAB — BASIC METABOLIC PANEL
Anion gap: 6 (ref 5–15)
BUN: 27 mg/dL — ABNORMAL HIGH (ref 6–20)
CO2: 24 mmol/L (ref 22–32)
Calcium: 8.3 mg/dL — ABNORMAL LOW (ref 8.9–10.3)
Chloride: 104 mmol/L (ref 101–111)
Creatinine, Ser: 2.03 mg/dL — ABNORMAL HIGH (ref 0.61–1.24)
GFR calc Af Amer: 43 mL/min — ABNORMAL LOW (ref >60)
GFR calc non Af Amer: 37 mL/min — ABNORMAL LOW (ref >60)
Glucose, Bld: 381 mg/dL — ABNORMAL HIGH (ref 65–99)
Potassium: 3.8 mmol/L (ref 3.5–5.1)
Sodium: 134 mmol/L — ABNORMAL LOW (ref 135–145)

## 2016-06-25 LAB — URINE DRUG SCREEN, QUALITATIVE (ARMC ONLY)
AMPHETAMINES, UR SCREEN: NOT DETECTED
BENZODIAZEPINE, UR SCRN: NOT DETECTED
Barbiturates, Ur Screen: NOT DETECTED
CANNABINOID 50 NG, UR ~~LOC~~: NOT DETECTED
Cocaine Metabolite,Ur ~~LOC~~: NOT DETECTED
MDMA (Ecstasy)Ur Screen: NOT DETECTED
Methadone Scn, Ur: NOT DETECTED
Opiate, Ur Screen: POSITIVE — AB
PHENCYCLIDINE (PCP) UR S: NOT DETECTED
TRICYCLIC, UR SCREEN: NOT DETECTED

## 2016-06-25 LAB — TSH: TSH: 3.475 u[IU]/mL (ref 0.350–4.500)

## 2016-06-25 LAB — HEMOGLOBIN A1C: Hgb A1c MFr Bld: 13.9 % — ABNORMAL HIGH (ref 4.0–6.0)

## 2016-06-25 MED ORDER — PIPERACILLIN-TAZOBACTAM 3.375 G IVPB 30 MIN
3.3750 g | Freq: Once | INTRAVENOUS | Status: AC
  Administered 2016-06-25: 3.375 g via INTRAVENOUS
  Filled 2016-06-25: qty 50

## 2016-06-25 MED ORDER — COLLAGENASE 250 UNIT/GM EX OINT
TOPICAL_OINTMENT | Freq: Every day | CUTANEOUS | Status: DC
  Administered 2016-06-25 – 2016-06-26 (×2): via TOPICAL
  Filled 2016-06-25: qty 30

## 2016-06-25 MED ORDER — INSULIN DETEMIR 100 UNIT/ML ~~LOC~~ SOLN
25.0000 [IU] | Freq: Two times a day (BID) | SUBCUTANEOUS | Status: DC
  Filled 2016-06-25 (×2): qty 0.25

## 2016-06-25 MED ORDER — ONDANSETRON HCL 4 MG/2ML IJ SOLN: 4.0000 mg | Freq: Four times a day (QID) | INTRAMUSCULAR | Status: DC | PRN

## 2016-06-25 MED ORDER — BUPROPION HCL ER (SR) 150 MG PO TB12
150.0000 mg | ORAL_TABLET | Freq: Two times a day (BID) | ORAL | Status: DC
  Administered 2016-06-25 – 2016-06-26 (×2): 150 mg via ORAL
  Filled 2016-06-25 (×2): qty 1

## 2016-06-25 MED ORDER — VANCOMYCIN HCL IN DEXTROSE 1-5 GM/200ML-% IV SOLN
1000.0000 mg | Freq: Once | INTRAVENOUS | Status: AC
  Administered 2016-06-25: 1000 mg via INTRAVENOUS
  Filled 2016-06-25: qty 200

## 2016-06-25 MED ORDER — INSULIN ASPART 100 UNIT/ML ~~LOC~~ SOLN
0.0000 [IU] | Freq: Every day | SUBCUTANEOUS | Status: DC
  Administered 2016-06-25: 2 [IU] via SUBCUTANEOUS
  Filled 2016-06-25: qty 5
  Filled 2016-06-25: qty 2

## 2016-06-25 MED ORDER — DULOXETINE HCL 60 MG PO CPEP
120.0000 mg | ORAL_CAPSULE | Freq: Every day | ORAL | Status: DC
  Administered 2016-06-25 – 2016-06-26 (×2): 120 mg via ORAL
  Filled 2016-06-25 (×2): qty 2

## 2016-06-25 MED ORDER — CARVEDILOL 6.25 MG PO TABS
25.0000 mg | ORAL_TABLET | Freq: Two times a day (BID) | ORAL | Status: DC
  Administered 2016-06-25 – 2016-06-26 (×2): 25 mg via ORAL
  Filled 2016-06-25 (×2): qty 4

## 2016-06-25 MED ORDER — SODIUM CHLORIDE 0.9 % IV SOLN
INTRAVENOUS | Status: DC
Start: 2016-06-25 — End: 2016-06-26
  Administered 2016-06-25 – 2016-06-26 (×2): via INTRAVENOUS

## 2016-06-25 MED ORDER — BACLOFEN 10 MG PO TABS
10.0000 mg | ORAL_TABLET | Freq: Three times a day (TID) | ORAL | Status: DC
  Administered 2016-06-25 – 2016-06-26 (×3): 10 mg via ORAL
  Filled 2016-06-25 (×3): qty 1

## 2016-06-25 MED ORDER — HYDROCODONE-ACETAMINOPHEN 10-325 MG PO TABS
1.0000 | ORAL_TABLET | Freq: Four times a day (QID) | ORAL | Status: DC | PRN
  Administered 2016-06-25 – 2016-06-26 (×2): 1 via ORAL
  Filled 2016-06-25 (×2): qty 1

## 2016-06-25 MED ORDER — PNEUMOCOCCAL VAC POLYVALENT 25 MCG/0.5ML IJ INJ: 0.5000 mL | INJECTION | INTRAMUSCULAR | Status: DC

## 2016-06-25 MED ORDER — ONDANSETRON HCL 4 MG PO TABS: 4.0000 mg | ORAL_TABLET | Freq: Four times a day (QID) | ORAL | Status: DC | PRN

## 2016-06-25 MED ORDER — VANCOMYCIN HCL 10 G IV SOLR
1500.0000 mg | Freq: Two times a day (BID) | INTRAVENOUS | Status: DC
  Administered 2016-06-25 (×2): 1500 mg via INTRAVENOUS
  Filled 2016-06-25 (×5): qty 1500

## 2016-06-25 MED ORDER — INSULIN ASPART 100 UNIT/ML ~~LOC~~ SOLN
5.0000 [IU] | Freq: Three times a day (TID) | SUBCUTANEOUS | Status: DC
  Administered 2016-06-25 – 2016-06-26 (×4): 5 [IU] via SUBCUTANEOUS
  Filled 2016-06-25 (×3): qty 5

## 2016-06-25 MED ORDER — INSULIN GLARGINE 100 UNIT/ML ~~LOC~~ SOLN: 28.0000 [IU] | Freq: Two times a day (BID) | SUBCUTANEOUS | Status: DC

## 2016-06-25 MED ORDER — PIPERACILLIN-TAZOBACTAM 4.5 G IVPB
4.5000 g | Freq: Three times a day (TID) | INTRAVENOUS | Status: DC
  Administered 2016-06-25 – 2016-06-26 (×4): 4.5 g via INTRAVENOUS
  Filled 2016-06-25 (×6): qty 100

## 2016-06-25 MED ORDER — HEPARIN SODIUM (PORCINE) 5000 UNIT/ML IJ SOLN
5000.0000 [IU] | Freq: Three times a day (TID) | INTRAMUSCULAR | Status: DC
  Administered 2016-06-25: 5000 [IU] via SUBCUTANEOUS
  Filled 2016-06-25 (×2): qty 1

## 2016-06-25 MED ORDER — ACETAMINOPHEN 650 MG RE SUPP
650.0000 mg | Freq: Four times a day (QID) | RECTAL | Status: DC | PRN
Start: 2016-06-25 — End: 2016-06-26

## 2016-06-25 MED ORDER — SODIUM CHLORIDE 0.9 % IV SOLN
1500.0000 mg | INTRAVENOUS | Status: DC
Start: 2016-06-25 — End: 2016-06-25
  Filled 2016-06-25: qty 1500

## 2016-06-25 MED ORDER — INSULIN ASPART 100 UNIT/ML ~~LOC~~ SOLN
0.0000 [IU] | Freq: Three times a day (TID) | SUBCUTANEOUS | Status: DC
  Administered 2016-06-25 (×2): 15 [IU] via SUBCUTANEOUS
  Administered 2016-06-25: 20 [IU] via SUBCUTANEOUS
  Administered 2016-06-26 (×2): 4 [IU] via SUBCUTANEOUS
  Filled 2016-06-25 (×2): qty 4
  Filled 2016-06-25: qty 20
  Filled 2016-06-25 (×2): qty 15

## 2016-06-25 MED ORDER — SODIUM CHLORIDE 0.9 % IV BOLUS (SEPSIS)
1000.0000 mL | INTRAVENOUS | Status: AC
  Administered 2016-06-25: 1000 mL via INTRAVENOUS

## 2016-06-25 MED ORDER — HEPARIN SODIUM (PORCINE) 5000 UNIT/ML IJ SOLN
5000.0000 [IU] | Freq: Three times a day (TID) | INTRAMUSCULAR | Status: DC
  Administered 2016-06-25 – 2016-06-26 (×3): 5000 [IU] via SUBCUTANEOUS
  Filled 2016-06-25 (×2): qty 1

## 2016-06-25 MED ORDER — ATORVASTATIN CALCIUM 20 MG PO TABS
40.0000 mg | ORAL_TABLET | Freq: Every day | ORAL | Status: DC
  Administered 2016-06-25: 40 mg via ORAL
  Filled 2016-06-25: qty 2

## 2016-06-25 MED ORDER — OLANZAPINE 10 MG PO TABS
20.0000 mg | ORAL_TABLET | Freq: Every day | ORAL | Status: DC
  Administered 2016-06-25: 20 mg via ORAL
  Filled 2016-06-25: qty 2

## 2016-06-25 MED ORDER — ACETAMINOPHEN 325 MG PO TABS: 650.0000 mg | ORAL_TABLET | Freq: Four times a day (QID) | ORAL | Status: DC | PRN

## 2016-06-25 MED ORDER — RIVAROXABAN 10 MG PO TABS: 20.0000 mg | ORAL_TABLET | Freq: Every day | ORAL | Status: DC

## 2016-06-25 MED ORDER — INSULIN GLARGINE 100 UNIT/ML ~~LOC~~ SOLN
60.0000 [IU] | Freq: Every day | SUBCUTANEOUS | Status: DC
  Administered 2016-06-25: 60 [IU] via SUBCUTANEOUS
  Filled 2016-06-25: qty 0.6

## 2016-06-25 MED ORDER — INSULIN GLARGINE 100 UNIT/ML ~~LOC~~ SOLN
28.0000 [IU] | Freq: Two times a day (BID) | SUBCUTANEOUS | Status: DC
  Administered 2016-06-25 – 2016-06-26 (×2): 28 [IU] via SUBCUTANEOUS
  Filled 2016-06-25 (×3): qty 0.28

## 2016-06-25 MED ORDER — FAMOTIDINE 20 MG PO TABS
20.0000 mg | ORAL_TABLET | Freq: Every day | ORAL | Status: DC
  Administered 2016-06-25: 20 mg via ORAL
  Filled 2016-06-25: qty 1

## 2016-06-25 NOTE — Progress Notes (Signed)
[facility] NAME: [name]    MR#:  [id number]  DATE OF BIRTH:  11/21/1965  SUBJECTIVE:  CHIEF COMPLAINT:   Chief Complaint  Patient presents with  . Suicidal   - depressed, not making eye contact - admitted to medical floor for right foot ulcer and hyperglycemia and also ARF - psych and podiatry consults pending - on suicide precautions, sitter in the room  REVIEW OF SYSTEMS:  Review of Systems  Constitutional: Positive for malaise/fatigue. Negative for chills and fever.  HENT: Negative for ear discharge, ear pain and nosebleeds.   Eyes: Negative for blurred vision and double vision.  Respiratory: Negative for cough, shortness of breath and wheezing.   Cardiovascular: Negative for chest pain, palpitations and leg swelling.  Gastrointestinal: Negative for abdominal pain, constipation, diarrhea, nausea and vomiting.  Genitourinary: Negative for dysuria.  Musculoskeletal: Positive for myalgias.  Neurological: Negative for dizziness, sensory change, speech change, focal weakness, seizures and headaches.  Psychiatric/Behavioral: Positive for depression and suicidal ideas.    DRUG ALLERGIES:   Allergies  Allergen Reactions  . Erythromycin Base Other (See Comments)    Reaction: heart flutter  . Metformin And Related Hives and Other (See Comments)    Reaction: sweating  . Percocet [Oxycodone-Acetaminophen] Nausea And Vomiting    VITALS:  Blood pressure (!) 172/93, pulse 82, temperature 98.1 F (36.7 C), temperature source Oral, resp. rate 15, height 6\' 3"  (1.905 m), weight 129.8 kg (286 lb 1.6 oz), SpO2 97 %.  PHYSICAL EXAMINATION:  Physical Exam  GENERAL:  50 y.o.-year-old patient lying in the bed with no acute distress.  EYES: Pupils equal, round, reactive to light and accommodation. No scleral icterus. Extraocular muscles intact.  HEENT: Head atraumatic, normocephalic. Oropharynx and nasopharynx clear.  NECK:   Supple, no jugular venous distention. No thyroid enlargement, no tenderness.  LUNGS: Normal breath sounds bilaterally, no wheezing, rales,rhonchi or crepitation. No use of accessory muscles of respiration.  CARDIOVASCULAR: S1, S2 normal. No murmurs, rubs, or gallops.  ABDOMEN: Soft, nontender, nondistended. Bowel sounds present. No organomegaly or mass.  EXTREMITIES: No cyanosis, or clubbing. 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 Muscle strength 5/5 in all extremities. Sensation intact. Gait not checked.  PSYCHIATRIC: The patient is alert and oriented x 3. Depressed. Passive suicidal ideation SKIN: No obvious rash, lesion, or ulcer.    LABORATORY PANEL:   CBC  Recent Labs Lab 06/24/16 2309  WBC 7.3  HGB 14.3  HCT 40.1  PLT 282   ------------------------------------------------------------------------------------------------------------------  Chemistries   Recent Labs Lab 06/24/16 2309 06/25/16 0947  NA 132* 134*  K 3.2* 3.8  CL 98* 104  CO2 22 24  GLUCOSE 311* 381*  BUN 28* 27*  CREATININE 2.75* 2.03*  CALCIUM 9.2 8.3*  AST 22  --   ALT 13*  --   ALKPHOS 111  --   BILITOT 0.4  --    ------------------------------------------------------------------------------------------------------------------  Cardiac Enzymes No results for input(s): TROPONINI in the last 168 hours. ------------------------------------------------------------------------------------------------------------------  RADIOLOGY:  Dg Foot 2 Views Right  Result Date: 06/25/2016 CLINICAL DATA:  Recurring ulcer on the bottom of the right foot for 6 months. History of diabetes, peripheral neuropathy, foot ulcers, left toe amputation. EXAM: RIGHT FOOT - 2 VIEW COMPARISON:  11/01/2008 FINDINGS: Since the previous study, there is interval development of osteolysis at the distal right fifth  metatarsal bone. This could represent osteomyelitis. No acute fractures are demonstrated. Degenerative changes in the interphalangeal and intertarsal joints. Vascular calcifications. IMPRESSION: Bone destruction along the distal fifth metatarsal bone could indicate osteomyelitis in the appropriate clinical setting. Electronically Signed   By: Lucienne Capers M.D.   On: 06/25/2016 02:22    EKG:   Orders placed or performed during the hospital encounter of 01/27/11  . EKG    ASSESSMENT AND PLAN:   50 year old male with past medical history significant for CK D stage III baseline creatinine around 2, insulin-dependent diabetes mellitus, hyperlipidemia and bipolar disorder with depression presents to the hospital secondary to suicidal thoughts. Medical admission requested for acute on chronic renal failure and possible osteomyelitis of right fifth metatarsal.  #1 acute osteomyelitis of distal fifth metatarsal bone of right foot-chronic diabetic foot ulceration with worsening clinical symptoms. -MRI pending. -Follow up cultures. On vancomycin and Zosyn. -Podiatry consult requested  #2 acute on chronic kidney disease-Baseline creatinine seems to be around 2, likely ATN from infection. -Gentle hydration, avoid nephrotoxins.  #3 uncontrolled diabetes mellitus-check A1c -On sliding scale insulin. Restart Lantus. Dose has been adjusted due to receiving high dose this morning. Also NovoLog added  #4 major depression with passive suicidal ideation-prior history of depression and homicidal ideation with behavioral medicine admission in the past. -Currently having passive suicidal thoughts. -Continue suicide precautions. Sitter at bedside. Psychiatry consult pending  #5 DVT prophylaxis-on subcutaneous heparin   All the records are reviewed and case discussed with Care Management/Social Workerr. Management plans discussed with the patient, family and they are in agreement.  CODE STATUS: Full  code  TOTAL TIME TAKING CARE OF THIS PATIENT: 37 minutes.   POSSIBLE D/C IN 2-3 DAYS, DEPENDING ON CLINICAL CONDITION.   Gladstone Lighter M.D on 06/25/2016 at 1:27 PM  Between 7am to 6pm - Pager - [phone]  After 6pm go to [email or web address] - password [email or web address]  Office  [phone]  CC: Primary care physician; Glori Bickers, MD

## 2016-06-25 NOTE — Progress Notes (Signed)
Pharmacy Antibiotic Note  [name] is a 50 y.o. male admitted on 06/25/2016 with wound infection.  Pharmacy has been consulted for vancomycin and Zosyn dosing.  Plan: DW 103kg  Vd 73L kei 0.044 hr-1  T1/2 16 hours Vancomycin 1500 mg q 18 hours ordered. Level before 5th overall dose. Goal trough 15-20.  Zosyn 4.5 grams q 8 hours ordered for TBW >120kg.  Height: 6\' 3"  (190.5 cm) Weight: 286 lb 1.6 oz (129.8 kg) IBW/kg (Calculated) : 84.5  Temp (24hrs), Avg:98.1 F (36.7 C), Min:98 F (36.7 C), Max:98.3 F (36.8 C)   Recent Labs Lab 06/24/16 2309  WBC 7.3  CREATININE 2.75*    Estimated Creatinine Clearance: 47.7 mL/min (by C-G formula based on SCr of 2.75 mg/dL).    Allergies  Allergen Reactions  . Erythromycin Base Other (See Comments)    Reaction: heart flutter  . Metformin And Related Hives and Other (See Comments)    Reaction: sweating  . Percocet [Oxycodone-Acetaminophen] Nausea And Vomiting    Antimicrobials this admission: vancomycin  >>  Zosyn  >>   Dose adjustments this admission:   Microbiology results: 8/28 wound cx pending  Thank you for allowing pharmacy to be a part of this patient's care.  Kimorah Ridolfi S 06/25/2016 6:33 AM

## 2016-06-25 NOTE — H&P (Signed)
[name] is an 50 y.o. male.   Chief Complaint: Suicidal thoughts HPI: The patient with past medical history of uncontrolled diabetes with neuropathy presents emergency department due to suicidal ideation. The patient was initially evaluated by psychiatry however laboratories revealed acute on chronic kidney injury. The patient also has a pressure ulcer on his right foot that he states recently reopened. The patient denies complaints. However x-ray of the foot revealed likely osteomyelitis which prompted emergency department staff to call for admission.  Past Medical History:  Diagnosis Date  . Chronic kidney disease (CKD), stage III (moderate)    Cr was 2.15 (02/2016)  . Chronic pain   . Diabetes mellitus    insulin-dependent  . Hyperlipemia   . Hypertension   . Schizophrenia [facility])     Past Surgical History:  Procedure Laterality Date  . AMPUTATION TOE Left 2014  . APPENDECTOMY      History reviewed. No pertinent family history. Social History:  reports that he has never smoked. He has never used smokeless tobacco. He reports that he does not drink alcohol or use drugs.  Allergies:  Allergies  Allergen Reactions  . Erythromycin Base Other (See Comments)    Reaction: heart flutter  . Metformin And Related Hives and Other (See Comments)    Reaction: sweating  . Percocet [Oxycodone-Acetaminophen] Nausea And Vomiting    Medications Prior to Admission  Medication Sig Dispense Refill  . insulin aspart (NOVOLOG) 100 UNIT/ML injection Inject 20-30 Units into the skin 3 (three) times daily before meals.    . insulin glargine (LANTUS) 100 unit/mL SOPN Inject 45 Units into the skin 2 (two) times daily.      Results for orders placed or performed during the hospital encounter of 06/25/16 (from the past 48 hour(s))  Comprehensive metabolic panel     Status: Abnormal   Collection Time: 06/24/16 11:09 PM  Result Value Ref Range   Sodium 132 (L) 135 - 145 mmol/L   Potassium 3.2  (L) 3.5 - 5.1 mmol/L   Chloride 98 (L) 101 - 111 mmol/L   CO2 22 22 - 32 mmol/L   Glucose, Bld 311 (H) 65 - 99 mg/dL   BUN 28 (H) 6 - 20 mg/dL   Creatinine, Ser 2.75 (H) 0.61 - 1.24 mg/dL   Calcium 9.2 8.9 - 10.3 mg/dL   Total Protein 6.8 6.5 - 8.1 g/dL   Albumin 3.1 (L) 3.5 - 5.0 g/dL   AST 22 15 - 41 U/L   ALT 13 (L) 17 - 63 U/L   Alkaline Phosphatase 111 38 - 126 U/L   Total Bilirubin 0.4 0.3 - 1.2 mg/dL   GFR calc non Af Amer 26 (L) >60 mL/min   GFR calc Af Amer 30 (L) >60 mL/min    Comment: (NOTE) The eGFR has been calculated using the CKD EPI equation. This calculation has not been validated in all clinical situations. eGFR's persistently <60 mL/min signify possible Chronic Kidney Disease.    Anion gap 12 5 - 15  Ethanol     Status: None   Collection Time: 06/24/16 11:09 PM  Result Value Ref Range   Alcohol, Ethyl (B) <5 <5 mg/dL    Comment:        LOWEST DETECTABLE LIMIT FOR SERUM ALCOHOL IS 5 mg/dL FOR MEDICAL PURPOSES ONLY   Salicylate level     Status: None   Collection Time: 06/24/16 11:09 PM  Result Value Ref Range   Salicylate Lvl <7.8 2.8 - 30.0  mg/dL  Acetaminophen level     Status: Abnormal   Collection Time: 06/24/16 11:09 PM  Result Value Ref Range   Acetaminophen (Tylenol), Serum <10 (L) 10 - 30 ug/mL    Comment:        THERAPEUTIC CONCENTRATIONS VARY SIGNIFICANTLY. A RANGE OF 10-30 ug/mL MAY BE AN EFFECTIVE CONCENTRATION FOR MANY PATIENTS. HOWEVER, SOME ARE BEST TREATED AT CONCENTRATIONS OUTSIDE THIS RANGE. ACETAMINOPHEN CONCENTRATIONS >150 ug/mL AT 4 HOURS AFTER INGESTION AND >50 ug/mL AT 12 HOURS AFTER INGESTION ARE OFTEN ASSOCIATED WITH TOXIC REACTIONS.   cbc     Status: None   Collection Time: 06/24/16 11:09 PM  Result Value Ref Range   WBC 7.3 3.8 - 10.6 K/uL   RBC 4.65 4.40 - 5.90 MIL/uL   Hemoglobin 14.3 13.0 - 18.0 g/dL   HCT 40.1 40.0 - 52.0 %   MCV 86.3 80.0 - 100.0 fL   MCH 30.8 26.0 - 34.0 pg   MCHC 35.6 32.0 - 36.0 g/dL    RDW 13.1 11.5 - 14.5 %   Platelets 282 150 - 440 K/uL  Urinalysis complete, with microscopic ([facility] only)     Status: Abnormal   Collection Time: 06/25/16 12:15 AM  Result Value Ref Range   Color, Urine YELLOW (A) YELLOW   APPearance HAZY (A) CLEAR   Glucose, UA NEGATIVE NEGATIVE mg/dL   Bilirubin Urine NEGATIVE NEGATIVE   Ketones, ur NEGATIVE NEGATIVE mg/dL   Specific Gravity, Urine 1.029 1.005 - 1.030   Hgb urine dipstick NEGATIVE NEGATIVE   pH 5.0 5.0 - 8.0   Protein, ur NEGATIVE NEGATIVE mg/dL   Nitrite NEGATIVE NEGATIVE   Leukocytes, UA NEGATIVE NEGATIVE   RBC / HPF 0-5 0 - 5 RBC/hpf   WBC, UA 0-5 0 - 5 WBC/hpf   Bacteria, UA RARE (A) NONE SEEN   Squamous Epithelial / LPF 0-5 (A) NONE SEEN  Urine Drug Screen, Qualitative ([facility] only)     Status: Abnormal   Collection Time: 06/25/16 12:15 AM  Result Value Ref Range   Tricyclic, Ur Screen NONE DETECTED NONE DETECTED   Amphetamines, Ur Screen NONE DETECTED NONE DETECTED   MDMA (Ecstasy)Ur Screen NONE DETECTED NONE DETECTED   Cocaine Metabolite,Ur [place] NONE DETECTED NONE DETECTED   Opiate, Ur Screen POSITIVE (A) NONE DETECTED   Phencyclidine (PCP) Ur S NONE DETECTED NONE DETECTED   Cannabinoid 50 Ng, Ur [place] NONE DETECTED NONE DETECTED   Barbiturates, Ur Screen NONE DETECTED NONE DETECTED   Benzodiazepine, Ur Scrn NONE DETECTED NONE DETECTED   Methadone Scn, Ur NONE DETECTED NONE DETECTED    Comment: (NOTE) 562  Tricyclics, urine               Cutoff 1000 ng/mL 200  Amphetamines, urine             Cutoff 1000 ng/mL 300  MDMA (Ecstasy), urine           Cutoff 500 ng/mL 400  Cocaine Metabolite, urine       Cutoff 300 ng/mL 500  Opiate, urine                   Cutoff 300 ng/mL 600  Phencyclidine (PCP), urine      Cutoff 25 ng/mL 700  Cannabinoid, urine              Cutoff 50 ng/mL 800  Barbiturates, urine             Cutoff 200 ng/mL 900  Benzodiazepine, urine  Cutoff 200 ng/mL 1000 Methadone, urine                 Cutoff 300 ng/mL 1100 1200 The urine drug screen provides only a preliminary, unconfirmed 1300 analytical test result and should not be used for non-medical 1400 purposes. Clinical consideration and professional judgment should 1500 be applied to any positive drug screen result due to possible 1600 interfering substances. A more specific alternate chemical method 1700 must be used in order to obtain a confirmed analytical result.  1800 Gas chromato graphy / mass spectrometry (GC/MS) is the preferred 1900 confirmatory method.    Dg Foot 2 Views Right  Result Date: 06/25/2016 CLINICAL DATA:  Recurring ulcer on the bottom of the right foot for 6 months. History of diabetes, peripheral neuropathy, foot ulcers, left toe amputation. EXAM: RIGHT FOOT - 2 VIEW COMPARISON:  11/01/2008 FINDINGS: Since the previous study, there is interval development of osteolysis at the distal right fifth metatarsal bone. This could represent osteomyelitis. No acute fractures are demonstrated. Degenerative changes in the interphalangeal and intertarsal joints. Vascular calcifications. IMPRESSION: Bone destruction along the distal fifth metatarsal bone could indicate osteomyelitis in the appropriate clinical setting. Electronically Signed   By: Lucienne Capers M.D.   On: 06/25/2016 02:22    Review of Systems  Constitutional: Negative for chills and fever.  HENT: Negative for sore throat and tinnitus.   Eyes: Negative for blurred vision and redness.  Respiratory: Negative for cough and shortness of breath.   Cardiovascular: Negative for chest pain, palpitations, orthopnea and PND.  Gastrointestinal: Negative for abdominal pain, diarrhea, nausea and vomiting.  Genitourinary: Negative for dysuria, frequency and urgency.  Musculoskeletal: Negative for joint pain and myalgias.  Skin: Negative for rash.       No lesions  Neurological: Negative for speech change, focal weakness and weakness.  Endo/Heme/Allergies: Does  not bruise/bleed easily.       No temperature intolerance  Psychiatric/Behavioral: Negative for depression and suicidal ideas.    Blood pressure (!) 147/70, pulse 90, temperature 98 F (36.7 C), temperature source Oral, resp. rate 20, height '6\' 3"'$  (1.905 m), weight 129.8 kg (286 lb 1.6 oz), SpO2 98 %. Physical Exam  Constitutional: He is oriented to person, place, and time. He appears well-developed and well-nourished. No distress.  HENT:  Head: Normocephalic and atraumatic.  Mouth/Throat: Oropharynx is clear and moist.  Eyes: Conjunctivae and EOM are normal. Pupils are equal, round, and reactive to light. No scleral icterus.  Neck: Normal range of motion. Neck supple. No JVD present. No tracheal deviation present. No thyromegaly present.  Cardiovascular: Normal rate, regular rhythm and normal heart sounds.  Exam reveals no gallop and no friction rub.   No murmur heard. Respiratory: Effort normal and breath sounds normal. No respiratory distress.  GI: Soft. Bowel sounds are normal. He exhibits no distension. There is no tenderness.  Genitourinary:  Genitourinary Comments: Deferred  Musculoskeletal: Normal range of motion. He exhibits no edema.  Lymphadenopathy:    He has no cervical adenopathy.  Neurological: He is alert and oriented to person, place, and time. No cranial nerve deficit.  Skin: Skin is warm and dry. No rash noted. No erythema.  Psychiatric: He has a normal mood and affect. His behavior is normal. Judgment and thought content normal.     Assessment/Plan This is a 50 year old male admitted for acute on chronic kidney injury as well as osteomyelitis. 1. Acute on chronic kidney injury: Creatinine from May 2017 was 2.1; today creatinine  is 2.75. Hypochloremia indicates dehydration. Rehydrate with intravenous fluid. Avoid nephrotoxic agents. Manage blood sugar. 2. Osteomyelitis: The patient is on vancomycin and Zosyn. Consult podiatry as well as wound care. 3. Suicidal  ideation: Consider at bedside 4. Diabetes mellitus type 2: Continue basal insulin adjusted for hospital diet and continue sliding scale insulin if on 5. DVT prophylaxis: Heparin 6. GI prophylaxis: None The patient is a full code. Time spent on admission was inpatient care approximately 45 minutes  Harrie Foreman, MD 06/25/2016, 6:36 AM

## 2016-06-25 NOTE — BH Assessment (Signed)
Assessment Note  [name] is an 50 y.o. male. [name] arrived to the ED by way of personal transportation by his wife.  He reports that he is currently having thoughts of suicide.  He reports that he would overdose on medications.  He reports symptoms of depression. He exhibits a flat demeanor.  He reports a decrease in his sleeping. He reports that he isolates himself. He has ruminating thoughts.  He denied new stressors.  He reports mild to moderate anxiety. He denied having auditory or visual hallucinations. He reports suicidal ideation and intent. He denied homicidal ideation or intent.  He denied the use of alcohol or drugs.  He is currently on disability.   He reports that he has been depressed for approximately 3-4 weeks.  He states that he has been able to fight off the suicidal feelings in the past, but today he could not ward off the thoughts in his head.  Diagnosis: Bipolar disorder  Past Medical History:  Past Medical History:  Diagnosis Date  . Chronic kidney disease (CKD), stage III (moderate)    Cr was 2.15 (02/2016)  . Chronic pain   . Diabetes mellitus    insulin-dependent  . Hyperlipemia   . Hypertension   . Schizophrenia [facility])     Past Surgical History:  Procedure Laterality Date  . APPENDECTOMY      Family History: History reviewed. No pertinent family history.  Social History:  reports that he has never smoked. He has never used smokeless tobacco. He reports that he drinks alcohol. He reports that he does not use drugs.  Additional Social History:  Alcohol / Drug Use History of alcohol / drug use?: No history of alcohol / drug abuse  CIWA: CIWA-Ar BP: (!) 141/90 Pulse Rate: 98 COWS:    Allergies:  Allergies  Allergen Reactions  . Erythromycin Base   . Metformin And Related   . Percocet [Oxycodone-Acetaminophen]     Home Medications:  (Not in a hospital admission)  OB/GYN Status:  No LMP for male patient.  General Assessment Data Location  of Assessment: [facility] ED TTS Assessment: In system Is this a Tele or Face-to-Face Assessment?: Face-to-Face Is this an Initial Assessment or a Re-assessment for this encounter?: Initial Assessment Marital status: Married [place] name: n/a Pregnancy Status: No Living Arrangements: Spouse/significant other Can pt return to current living arrangement?: Yes Admission Status: Voluntary Is patient capable of signing voluntary admission?: Yes Referral Source: Self/Family/Friend Insurance type: Medicaid  Medical Screening Exam ([place]) Medical Exam completed: Yes  Crisis Care Plan Living Arrangements: Spouse/significant other Legal Guardian: Other: (Self) Name of Psychiatrist: [facility] (1st appointment has not occurred as yet) Name of Therapist: None   Education Status Is patient currently in school?: No Current Grade: n/a Highest grade of school patient has completed: 9th Name of school: [employer] [place], [place], [place] Contact person: n/a  Risk to self with the past 6 months Suicidal Ideation: Yes-Currently Present Has patient been a risk to self within the past 6 months prior to admission? : Yes Suicidal Intent: Yes-Currently Present Has patient had any suicidal intent within the past 6 months prior to admission? : Yes Is patient at risk for suicide?: Yes (Currently in the hospital) Suicidal Plan?: Yes-Currently Present Has patient had any suicidal plan within the past 6 months prior to admission? : Yes Specify Current Suicidal Plan: Overdose Access to Means: No (Currently in the hospital) What has been your use of drugs/alcohol within the last  12 months?: denied use Previous Attempts/Gestures: No How many times?: 0 Other Self Harm Risks: denied Triggers for Past Attempts: Unknown Intentional Self Injurious Behavior: None Family Suicide History: No Recent stressful life event(s): Other (Comment) Persecutory voices/beliefs?: No Depression: Yes Depression  Symptoms: Despondent, Loss of interest in usual pleasures Substance abuse history and/or treatment for substance abuse?: No Suicide prevention information given to non-admitted patients: Not applicable  Risk to Others within the past 6 months Homicidal Ideation: No Does patient have any lifetime risk of violence toward others beyond the six months prior to admission? : No Thoughts of Harm to Others: No Current Homicidal Intent: No Current Homicidal Plan: No Access to Homicidal Means: No Identified Victim: None identified History of harm to others?: No Assessment of Violence: None Noted Violent Behavior Description: denied Does patient have access to weapons?: No Criminal Charges Pending?: No Does patient have a court date: No Is patient on probation?: No  Psychosis Hallucinations: None noted Delusions: None noted  Mental Status Report Appearance/Hygiene: In scrubs Eye Contact: Poor Motor Activity: Unremarkable Speech: Soft Level of Consciousness: Alert Mood: Depressed Affect: Flat Anxiety Level: Moderate Thought Processes: Coherent Judgement: Unimpaired Orientation: Person, Time, Place, Situation Obsessive Compulsive Thoughts/Behaviors: None  Cognitive Functioning Concentration: Normal Memory: Recent Intact IQ: Average Insight: Poor Impulse Control: Fair Appetite: Fair Sleep: Decreased Vegetative Symptoms: None  ADLScreening [facility] Assessment Services) Patient's cognitive ability adequate to safely complete daily activities?: Yes Patient able to express need for assistance with ADLs?: Yes Independently performs ADLs?: Yes (appropriate for developmental age)  Prior Inpatient Therapy Prior Inpatient Therapy: Yes Prior Therapy Dates: "Several years ago" Prior Therapy Facilty/Provider(s): [facility], [facility] Reason for Treatment: Homicidal, Bipolar, Schizophrenia, Depression  Prior Outpatient Therapy Prior Outpatient Therapy: Yes Prior Therapy Dates:  Current Prior Therapy Facilty/Provider(s): Unknown Reason for Treatment: Bipolar disorder Does patient have an ACCT team?: No Does patient have Intensive In-House Services?  : No Does patient have [facility] services? : No Does patient have P4CC services?: No  ADL Screening (condition at time of admission) Patient's cognitive ability adequate to safely complete daily activities?: Yes Patient able to express need for assistance with ADLs?: Yes Independently performs ADLs?: Yes (appropriate for developmental age)       Abuse/Neglect Assessment (Assessment to be complete while patient is alone) Physical Abuse: Denies Verbal Abuse: Denies Sexual Abuse: Denies Exploitation of patient/patient's resources: Denies Self-Neglect: Denies          Additional Information 1:1 In Past 12 Months?: No CIRT Risk: No Elopement Risk: No Does patient have medical clearance?: No     Disposition:  Disposition Initial Assessment Completed for this Encounter: Yes Disposition of Patient: Other dispositions  On Site Evaluation by:   Reviewed with Physician:    Elmer Bales 06/25/2016 2:56 AM

## 2016-06-25 NOTE — Consult Note (Signed)
ORTHOPAEDIC CONSULTATION  REQUESTING PHYSICIAN: Gladstone Lighter, MD  Chief Complaint: Right foot possible osteomyelitis  HPI: [name] is a 50 y.o. male who complains of  right foot ulceration. 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 Patient states she's been seen at the [facility] wound care center for some time. Somewhat of a poor historian in regards to his care. He denies any surgery to his right foot in the past. MRI was performed today. He is status post left fourth and fifth ray amputations.  Past Medical History:  Diagnosis Date  . Chronic kidney disease (CKD), stage III (moderate)    Cr was 2.15 (02/2016)  . Chronic pain   . Diabetes mellitus    insulin-dependent  . Hyperlipemia   . Hypertension   . Schizophrenia [facility])    Past Surgical History:  Procedure Laterality Date  . AMPUTATION TOE Left 2014  . APPENDECTOMY     Social History   Social History  . Marital status: Married    Spouse name: N/A  . Number of children: N/A  . Years of education: N/A   Social History Main Topics  . Smoking status: Never Smoker  . Smokeless tobacco: Never Used  . Alcohol use No     Comment: occ  . Drug use: No  . Sexual activity: Yes    Birth control/ protection: None   Other Topics Concern  . None   Social History Narrative  . None   History reviewed. No pertinent family history. Allergies  Allergen Reactions  . Erythromycin Base Other (See Comments)    Reaction: heart flutter  . Metformin And Related Hives and Other (See Comments)    Reaction: sweating  . Percocet [Oxycodone-Acetaminophen] Nausea And Vomiting   Prior to Admission medications   Medication Sig Start Date End Date Taking? Authorizing Provider  amlodipine-atorvastatin (CADUET) 10-10 MG tablet Take 1 tablet by mouth daily.   Yes Historical Provider, MD  atorvastatin (LIPITOR) 40 MG tablet Take 40 mg by mouth daily.   Yes Historical Provider, MD   baclofen (LIORESAL) 10 MG tablet Take 10 mg by mouth 3 (three) times daily.   Yes Historical Provider, MD  buPROPion (ZYBAN) 150 MG 12 hr tablet Take 150 mg by mouth 2 (two) times daily.   Yes Historical Provider, MD  DULoxetine (CYMBALTA) 60 MG capsule Take 120 mg by mouth daily.   Yes Historical Provider, MD  famotidine (PEPCID) 20 MG tablet Take 20 mg by mouth at bedtime.   Yes Historical Provider, MD  hydrochlorothiazide (HYDRODIURIL) 25 MG tablet Take 25 mg by mouth daily.   Yes Historical Provider, MD  HYDROcodone-acetaminophen (NORCO) 10-325 MG tablet Take 2 tablets by mouth every 6 (six) hours as needed.   Yes Historical Provider, MD  insulin aspart (NOVOLOG) 100 UNIT/ML injection Inject 20-30 Units into the skin 3 (three) times daily before meals.   Yes Historical Provider, MD  insulin glargine (LANTUS) 100 unit/mL SOPN Inject 45 Units into the skin 2 (two) times daily.   Yes Historical Provider, MD  OLANZapine zydis (ZYPREXA) 20 MG disintegrating tablet Take 20 mg by mouth at bedtime.   Yes Historical Provider, MD  OLANZapine zydis (ZYPREXA) 5 MG disintegrating tablet Take 5 mg by mouth at bedtime.   Yes Historical Provider, MD  rivaroxaban (XARELTO) 20 MG TABS tablet Take 20 mg by mouth daily with supper.   Yes Historical Provider, MD  carvedilol (COREG) 25 MG tablet Take 25 mg by  mouth 2 (two) times daily with a meal.    Historical Provider, MD   Mr Foot Right Wo Contrast  Result Date: 06/25/2016 CLINICAL DATA:  Acute osteomyelitis of the fifth metatarsal. Diabetes and chronic foot ulceration with worsening symptoms. EXAM: MRI OF THE RIGHT FOREFOOT WITHOUT CONTRAST TECHNIQUE: Multiplanar, multisequence MR imaging was performed. No intravenous contrast was administered. COMPARISON:  Radiographs of 06/25/2016 FINDINGS: Abnormal tapered and eroded distal fifth metatarsal with pointed distal margin are resorption of the fifth metatarsal head, but without significant marrow edema signal in  knee irregular and tapered distal fifth metatarsal or in the base of the proximal phalanx of the fifth toe. Just plantar to the eroded and irregular distal fifth metatarsal, there is a 2.8 by 1.7 by 2.2 cm structure with primarily intermediate T1 and primarily high but heterogeneous T2 signal characteristics extending into the subcutaneous tissues, which could be due to synovitis, inflammatory nodule, or a local phlegmon. Cutaneous thickening and low subcutaneous T1 signal noted in the tissues lateral to the fifth MTP joint, and there is potentially ulceration and/or microscopic metal foreign body along the skin surface of the lateral ball of the foot on images 24-27 series 4. Dorsal subcutaneous edema in the foot is present. There is abnormal cortical scalloping and marrow edema in the base and proximal metaphysis of the third metatarsal as well as erosions between the lateral cuneiform and cuboid, and low-level erosions along the bases of the second and third metatarsals with associated marrow edema. The Lisfranc ligament is intact. Degenerative findings at the articulation of the first metatarsal head with the sesamoids. The edema from the dorsum of the foot extends into the toes. No compelling findings of Eden Lathe 's neuroma. There is some distal intermetatarsal bursitis between the fourth and third metatarsal heads. IMPRESSION: 1. Resorption of the head of the fifth metatarsal with tapered distal margin, and underlying nodular structure below the fifth MTP joint tracking in the subcutaneous tissues. There is little in the way of marrow edema in this vicinity. Differential diagnostic considerations favor erosive or inflammatory arthropathy such as rheumatoid arthropathy, psoriatic arthropathy, or gout, and a prior fifth metatarsal fracture could also contribute to some of this appearance. Strictly speaking chronic osteomyelitis with chronic resorption of the fifth metatarsal head could also appear similarly. 2.  Low T1 signal but also low T2 signal in the subcutaneous tissues lateral to the fifth MTP joint, nonspecific but potentially related to chronic inflammation. There is dorsal subcutaneous edema in the foot tracking into the toes. 3. Erosive lesions along the base of the third metatarsal especially, but also along the other parts of the adjacent Lisfranc joint, may be from erosive arthropathy or less likely osteomyelitis involving the Lisfranc joint. There is likely some associated periostitis. None of these erosions were present on 11/01/2008. There is secondary marrow edema proximally in the third and fourth metatarsals. Electronically Signed   By: Van Clines M.D.   On: 06/25/2016 15:12   Dg Foot 2 Views Right  Result Date: 06/25/2016 CLINICAL DATA:  Recurring ulcer on the bottom of the right foot for 6 months. History of diabetes, peripheral neuropathy, foot ulcers, left toe amputation. EXAM: RIGHT FOOT - 2 VIEW COMPARISON:  11/01/2008 FINDINGS: Since the previous study, there is interval development of osteolysis at the distal right fifth metatarsal bone. This could represent osteomyelitis. No acute fractures are demonstrated. Degenerative changes in the interphalangeal and intertarsal joints. Vascular calcifications. IMPRESSION: Bone destruction along the distal fifth metatarsal bone could indicate  osteomyelitis in the appropriate clinical setting. Electronically Signed   By: Lucienne Capers M.D.   On: 06/25/2016 02:22    Positive ROS: All other systems have been reviewed and were otherwise negative with the exception of those mentioned in the HPI and as above.  12 point ROS was performed.  Physical Exam: General: Alert and oriented.  No apparent distress.  Vascular:  Left foot:Dorsalis Pedis:  present Posterior Tibial:  diminished  Right foot: Dorsalis Pedis:  present Posterior Tibial:  diminished  Neuro:absent protective sensation.  Derm: Left foot without ulceration. Multiple areas  of scarring secondary to surgical partial resection fourth and fifth rays.  Right foot with ulceration of the plantar aspect of the right fourth MTPJ. Case tissue noted. A scant amount of fluid with probing of the wound today. Severe purulence noted. Mild erythema surrounding the ulcerative site consistent with irritation. Hyperkeratosis noted. Wound does not probe deeper than the subcutaneous tissue and does not probe to bone. No sinus tracts noted.  Ortho/MS: He is status post left fourth and fifth ray amputations.  His right fifth toe sits in a slightly dorsally dislocated position. He has diffuse edema to bilateral lower extremity. No crepitus range of motion anywhere.  X-rays: I personally reviewed the x-rays which are consistent with a history of a fifth metatarsal head resection. This looks to be bone growth from the resected metatarsal head. No gas in the soft tissue or erosive changes throughout the remaining portion of the foot.  MRI: IMPRESSION: 1. Resorption of the head of the fifth metatarsal with tapered distal margin, and underlying nodular structure below the fifth MTP joint tracking in the subcutaneous tissues. There is little in the way of marrow edema in this vicinity. Differential diagnostic considerations favor erosive or inflammatory arthropathy such as rheumatoid arthropathy, psoriatic arthropathy, or gout, and a prior fifth metatarsal fracture could also contribute to some of this appearance. Strictly speaking chronic osteomyelitis with chronic resorption of the fifth metatarsal head could also appear similarly. 2. Low T1 signal but also low T2 signal in the subcutaneous tissues lateral to the fifth MTP joint, nonspecific but potentially related to chronic inflammation. There is dorsal subcutaneous edema in the foot tracking into the toes. 3. Erosive lesions along the base of the third metatarsal especially, but also along the other parts of the adjacent  Lisfranc joint, may be from erosive arthropathy or less likely osteomyelitis involving the Lisfranc joint. There is likely some associated periostitis. None of these erosions were present on 11/01/2008. There is secondary marrow edema proximally in the third and fourth metatarsals.  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   Plan: I reviewed his history and found notes and the [facility] that reveals he underwent fifth metatarsal head resection in 2016. Since that time he's had multiple x-rays that has shown the changes around the fifth metatarsal head consistent with resection. He's had 2 MRIs that did not show any signs of osteomyelitis. The current MRI does have radiographic findings that cannot completely rule out chronic osteomyelitis but the wound does not probe to bone and not actively infected. Clinically this looks to be a chronic plantar fourth MTPJ ulceration with likely residual bony regrowth from the fifth metatarsal head resection without active infection. MRIs showed other nonspecific chronic inflammatory issues and remote erosive changes along the base of the third metatarsal. This is likely more consistent with arthropathy, possibly neuropathic arthropathy, and likely not active infection. His white blood cell count is within  normal limits.  At this time I favor continued conservative treatment and monitoring. He should closely be followed up in the outpatient clinic by [facility] wound care.  Home dressing changes with well-padded gauze dressing and topical antibiotic ointment  and he should use the postop healing shoe that was dispensed by [facility]. He states he has this and this is at home as well.    Elesa Hacker, DPM Cell [phone]   06/25/2016 6:09 PM

## 2016-06-25 NOTE — ED Notes (Signed)
Attempted to call report x1. Helene Kelp RN is discussing need for sitter with Charise Carwin.

## 2016-06-25 NOTE — ED Provider Notes (Signed)
[facility] Emergency Department Provider Note  ____________________________________________   First MD Initiated Contact with Patient 06/25/16 0132     (approximate)  I have reviewed the triage vital signs and the nursing notes.   HISTORY  Chief Complaint Suicidal    HPI [name] is a 50 y.o. male with an extensive past medical history that includesschizophrenia, obesity, insulin-dependent diabetes, hypertension, and hyperlipidemia who presents for evaluation of suicidal ideation.  He reports that he has been taking all of his medications, both medical and psychiatric, but for the last 3 days he has had gradual but rapidly worsening depression.  It is lead to suicidal ideation and he states that he absolutely would kill himself if given the chance.  He has thought about several options such as a gun (to which he has access at home), overdose, or stepping in front of traffic.  He told the triage nurse that is soon as he leaves here he will kill himself.  He says that nothing in particular seems to be making the symptoms worse and nothing will make it better.  He denies any acute medical concerns at this time except that he has had a diabetic foot ulcer on the bottom of his right foot for about 3 weeks.  He has been seen previously in the wound care clinic and it healed over but it broke open again 3 weeks ago and has been steadily getting worse since that time.  He denies fever/chills, chest pain, shortness of breath, nausea, vomiting, diarrhea, dysuria.   Past Medical History:  Diagnosis Date  . Chronic kidney disease (CKD), stage III (moderate)    Cr was 2.15 (02/2016)  . Chronic pain   . Diabetes mellitus    insulin-dependent  . Hyperlipemia   . Hypertension   . Schizophrenia ([place])     There are no active problems to display for this patient.   Past Surgical History:  Procedure Laterality Date  . APPENDECTOMY      Prior to Admission  medications   Not on File    Allergies Erythromycin base; Metformin and related; and Percocet [oxycodone-acetaminophen]  History reviewed. No pertinent family history.  Social History Social History  Substance Use Topics  . Smoking status: Never Smoker  . Smokeless tobacco: Never Used  . Alcohol use Yes     Comment: occ    Review of Systems Constitutional: No fever/chills Eyes: No visual changes. ENT: No sore throat. Cardiovascular: Denies chest pain. Respiratory: Denies shortness of breath. Gastrointestinal: No abdominal pain.  No nausea, no vomiting.  No diarrhea.  No constipation. Genitourinary: Negative for dysuria. Musculoskeletal: Negative for back pain. Skin: Negative for rash.  Diabetic foot ulcer (right) Neurological: Negative for headaches, focal weakness or numbness. Psych:  Depression, active suicidal ideation, wants to kill himself  10-point ROS otherwise negative.  ____________________________________________   PHYSICAL EXAM:  VITAL SIGNS: ED Triage Vitals  Enc Vitals Group     BP 06/24/16 2249 (!) 141/90     Pulse Rate 06/24/16 2249 98     Resp --      Temp 06/24/16 2249 98.3 F (36.8 C)     Temp Source 06/24/16 2249 Oral     SpO2 06/24/16 2249 99 %     Weight 06/24/16 2250 299 lb (135.6 kg)     Height 06/24/16 2250 6\' 3"  (1.905 m)     Head Circumference --      Peak Flow --  Pain Score 06/24/16 2253 7     Pain Loc --      Pain Edu? --      Excl. in [place]? --     Constitutional: Alert and oriented. Well appearing and in no acute distress. Eyes: Conjunctivae are normal. PERRL. EOMI. Head: Atraumatic. Nose: No congestion/rhinnorhea. Mouth/Throat: Mucous membranes are moist.  Oropharynx non-erythematous. Neck: No stridor.  No meningeal signs.   Cardiovascular: Normal rate, regular rhythm. Good peripheral circulation. Grossly normal heart sounds. Respiratory: Normal respiratory effort.  No retractions. Lungs CTAB. Gastrointestinal: Obese.   Soft and nontender. No distention.  Musculoskeletal: No lower extremity tenderness nor edema. No gross deformities of extremities. Neurologic:  Normal speech and language. No gross focal neurologic deficits are appreciated.  Skin:  Skin is warm, dry and intact.  Chronic appearing diabetic foot ulcer on the bottom of his right foot with no surrounding cellulitis or purulence. Psychiatric: Mood and affect are depressed.  Active suicidal ideation.   ____________________________________________   LABS (all labs ordered are listed, but only abnormal results are displayed)  Labs Reviewed  COMPREHENSIVE METABOLIC PANEL - Abnormal; Notable for the following:       Result Value   Sodium 132 (*)    Potassium 3.2 (*)    Chloride 98 (*)    Glucose, Bld 311 (*)    BUN 28 (*)    Creatinine, Ser 2.75 (*)    Albumin 3.1 (*)    ALT 13 (*)    GFR calc non Af Amer 26 (*)    GFR calc Af Amer 30 (*)    All other components within normal limits  ACETAMINOPHEN LEVEL - Abnormal; Notable for the following:    Acetaminophen (Tylenol), Serum <10 (*)    All other components within normal limits  URINALYSIS COMPLETEWITH MICROSCOPIC ([facility] ONLY) - Abnormal; Notable for the following:    Color, Urine YELLOW (*)    APPearance HAZY (*)    Bacteria, UA RARE (*)    Squamous Epithelial / LPF 0-5 (*)    All other components within normal limits  URINE DRUG SCREEN, QUALITATIVE ([facility] ONLY) - Abnormal; Notable for the following:    Opiate, Ur Screen POSITIVE (*)    All other components within normal limits  AEROBIC/ANAEROBIC CULTURE (SURGICAL/DEEP WOUND)  ETHANOL  SALICYLATE LEVEL  CBC   ____________________________________________  EKG  None - EKG not ordered by ED physician ____________________________________________  RADIOLOGY   Dg Foot 2 Views Right  Result Date: 06/25/2016 CLINICAL DATA:  Recurring ulcer on the bottom of the right foot for 6 months. History of diabetes, peripheral neuropathy,  foot ulcers, left toe amputation. EXAM: RIGHT FOOT - 2 VIEW COMPARISON:  11/01/2008 FINDINGS: Since the previous study, there is interval development of osteolysis at the distal right fifth metatarsal bone. This could represent osteomyelitis. No acute fractures are demonstrated. Degenerative changes in the interphalangeal and intertarsal joints. Vascular calcifications. IMPRESSION: Bone destruction along the distal fifth metatarsal bone could indicate osteomyelitis in the appropriate clinical setting. Electronically Signed   By: Lucienne Capers M.D.   On: 06/25/2016 02:22    ____________________________________________   PROCEDURES  Procedure(s) performed:   Procedures   Critical Care performed: No ____________________________________________   INITIAL IMPRESSION / ASSESSMENT AND PLAN / ED COURSE  Pertinent labs & imaging results that were available during my care of the patient were reviewed by me and considered in my medical decision making (see chart for details).  We do not have labs in our system  from this patient for 5 years, but I reviewed a clinic note which showed that his creatinine 3 months ago was 2.15.  Today it is elevated to 2.7.  This is concerning but not necessarily indicative of acute kidney disease.  I will give him a liter bolus and recheck his metabolic panel.  Of concern as well is the fact that he appears to have osteomyelitis on his radiograph.  I will give him antibiotics.  He may need medical admission as well as psychiatric evaluation.  I will reassess after his repeat metabolic panel.   Clinical Course  Value Comment By Time  DG Foot 2 Views Right Interval development of osteomyelitis, unknown chronicity.  Hinda Kehr, MD 08/28 0321   The patient has too many acute on chronic medical concerns for me to keep him simply as a psychiatric patient.  I am giving him Zosyn and vancomycin for his osteomyelitis and have admitted to the hospitalist for further  management of his multiple medical issues.  He will remain under involuntary commitment with a psychiatric consult pending. 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    Hinda Kehr, MD 06/25/16 [phone]

## 2016-06-25 NOTE — Consult Note (Signed)
[name] Psychiatry Consult   Reason for Consult:  Consult for 50 year old man with a history of schizoaffective disorder currently on the medical service for renal failure Referring Physician:  Tressia Miners Patient Identification: [name] MRN:  [id number] Principal Diagnosis: Schizophrenia [facility]) Diagnosis:   Patient Active Problem List   Diagnosis Date Noted  . Acute on chronic kidney failure ([place]) [N17.9, N18.9] 06/25/2016  . Schizophrenia ([place]) [F20.9] 06/25/2016  . Suicidal ideation [R45.851] 06/25/2016    Total Time spent with patient: 1 hour  Subjective:   [name] is a 50 y.o. male patient admitted with "I'm having suicidal thoughts".  HPI:  Patient interviewed. Chart reviewed. Labs and vitals reviewed. This is a 50 year old man with a history of schizoaffective disorder. He came into the emergency room saying he was having suicidal thoughts for the last few days. Active thoughts about shooting himself. He's been depressed for several weeks. Sleep has been poor. Appetite poor. Feeling negative all the time. Having auditory hallucinations telling him to kill himself. He says he has been compliant with all of his prescribed medicines. He does not know of any new stressor that would've set this off. Denies any substance abuse. Came into the emergency room for psychiatric evaluation but admitted to the internal medicine service for renal failure.  Medical history: History of diabetes and high blood pressure and elevated cholesterol. Newly discovered to have more renal failure than he had thought.  Social history: Lives with his wife. He has adult children who do not live at home. He is on disability. Very does very little during the day. Little activity.  Substance abuse history: Denies any alcohol but has had problems with it in the past. No history of any other drug abuse  Past Psychiatric History: Long-standing problem with schizoaffective disorder. He's had a  couple of hospitalizations in the past but mostly in the past it has been for homicidal ideation. Has never tried to kill himself in the past. Is currently going to the step clinic at [facility] for outpatient treatment.  Risk to Self: Suicidal Ideation: Yes-Currently Present Suicidal Intent: Yes-Currently Present Is patient at risk for suicide?: Yes Suicidal Plan?: Yes-Currently Present Specify Current Suicidal Plan: Overdose Access to Means: No (Currently in the hospital) What has been your use of drugs/alcohol within the last 12 months?: denied use How many times?: 0 Other Self Harm Risks: denied Triggers for Past Attempts: Unknown Intentional Self Injurious Behavior: None Risk to Others: Homicidal Ideation: No Thoughts of Harm to Others: No Current Homicidal Intent: No Current Homicidal Plan: No Access to Homicidal Means: No Identified Victim: None identified History of harm to others?: No Assessment of Violence: None Noted Violent Behavior Description: denied Does patient have access to weapons?: No Criminal Charges Pending?: No Does patient have a court date: No Prior Inpatient Therapy: Prior Inpatient Therapy: Yes Prior Therapy Dates: "Several years ago" Prior Therapy Facilty/Provider(s): [facility], [facility] Reason for Treatment: Homicidal, Bipolar, Schizophrenia, Depression Prior Outpatient Therapy: Prior Outpatient Therapy: Yes Prior Therapy Dates: Current Prior Therapy Facilty/Provider(s): Unknown Reason for Treatment: Bipolar disorder Does patient have an ACCT team?: No Does patient have Intensive In-House Services?  : No Does patient have [facility] services? : No Does patient have P4CC services?: No  Past Medical History:  Past Medical History:  Diagnosis Date  . Chronic kidney disease (CKD), stage III (moderate)    Cr was 2.15 (02/2016)  . Chronic pain   . Diabetes mellitus    insulin-dependent  .  Hyperlipemia   . Hypertension   . Schizophrenia [facility])      Past Surgical History:  Procedure Laterality Date  . AMPUTATION TOE Left 2014  . APPENDECTOMY     Family History: History reviewed. No pertinent family history. Family Psychiatric  History: Brother attempted suicide Social History:  History  Alcohol Use No    Comment: occ     History  Drug Use No    Social History   Social History  . Marital status: Married    Spouse name: N/A  . Number of children: N/A  . Years of education: N/A   Social History Main Topics  . Smoking status: Never Smoker  . Smokeless tobacco: Never Used  . Alcohol use No     Comment: occ  . Drug use: No  . Sexual activity: Yes    Birth control/ protection: None   Other Topics Concern  . None   Social History Narrative  . None   Additional Social History:    Allergies:   Allergies  Allergen Reactions  . Erythromycin Base Other (See Comments)    Reaction: heart flutter  . Metformin And Related Hives and Other (See Comments)    Reaction: sweating  . Percocet [Oxycodone-Acetaminophen] Nausea And Vomiting    Labs:  Results for orders placed or performed during the hospital encounter of 06/25/16 (from the past 48 hour(s))  Comprehensive metabolic panel     Status: Abnormal   Collection Time: 06/24/16 11:09 PM  Result Value Ref Range   Sodium 132 (L) 135 - 145 mmol/L   Potassium 3.2 (L) 3.5 - 5.1 mmol/L   Chloride 98 (L) 101 - 111 mmol/L   CO2 22 22 - 32 mmol/L   Glucose, Bld 311 (H) 65 - 99 mg/dL   BUN 28 (H) 6 - 20 mg/dL   Creatinine, Ser 2.75 (H) 0.61 - 1.24 mg/dL   Calcium 9.2 8.9 - 10.3 mg/dL   Total Protein 6.8 6.5 - 8.1 g/dL   Albumin 3.1 (L) 3.5 - 5.0 g/dL   AST 22 15 - 41 U/L   ALT 13 (L) 17 - 63 U/L   Alkaline Phosphatase 111 38 - 126 U/L   Total Bilirubin 0.4 0.3 - 1.2 mg/dL   GFR calc non Af Amer 26 (L) >60 mL/min   GFR calc Af Amer 30 (L) >60 mL/min    Comment: (NOTE) The eGFR has been calculated using the CKD EPI equation. This calculation has not been validated in  all clinical situations. eGFR's persistently <60 mL/min signify possible Chronic Kidney Disease.    Anion gap 12 5 - 15  Ethanol     Status: None   Collection Time: 06/24/16 11:09 PM  Result Value Ref Range   Alcohol, Ethyl (B) <5 <5 mg/dL    Comment:        LOWEST DETECTABLE LIMIT FOR SERUM ALCOHOL IS 5 mg/dL FOR MEDICAL PURPOSES ONLY   Salicylate level     Status: None   Collection Time: 06/24/16 11:09 PM  Result Value Ref Range   Salicylate Lvl <1.6 2.8 - 30.0 mg/dL  Acetaminophen level     Status: Abnormal   Collection Time: 06/24/16 11:09 PM  Result Value Ref Range   Acetaminophen (Tylenol), Serum <10 (L) 10 - 30 ug/mL    Comment:        THERAPEUTIC CONCENTRATIONS VARY SIGNIFICANTLY. A RANGE OF 10-30 ug/mL MAY BE AN EFFECTIVE CONCENTRATION FOR MANY PATIENTS. HOWEVER, SOME ARE BEST TREATED AT CONCENTRATIONS  OUTSIDE THIS RANGE. ACETAMINOPHEN CONCENTRATIONS >150 ug/mL AT 4 HOURS AFTER INGESTION AND >50 ug/mL AT 12 HOURS AFTER INGESTION ARE OFTEN ASSOCIATED WITH TOXIC REACTIONS.   cbc     Status: None   Collection Time: 06/24/16 11:09 PM  Result Value Ref Range   WBC 7.3 3.8 - 10.6 K/uL   RBC 4.65 4.40 - 5.90 MIL/uL   Hemoglobin 14.3 13.0 - 18.0 g/dL   HCT 40.1 40.0 - 52.0 %   MCV 86.3 80.0 - 100.0 fL   MCH 30.8 26.0 - 34.0 pg   MCHC 35.6 32.0 - 36.0 g/dL   RDW 13.1 11.5 - 14.5 %   Platelets 282 150 - 440 K/uL  Hemoglobin A1c     Status: Abnormal   Collection Time: 06/24/16 11:09 PM  Result Value Ref Range   Hgb A1c MFr Bld 13.9 (H) 4.0 - 6.0 %  TSH     Status: None   Collection Time: 06/24/16 11:09 PM  Result Value Ref Range   TSH 3.475 0.350 - 4.500 uIU/mL  Urinalysis complete, with microscopic ([facility] only)     Status: Abnormal   Collection Time: 06/25/16 12:15 AM  Result Value Ref Range   Color, Urine YELLOW (A) YELLOW   APPearance HAZY (A) CLEAR   Glucose, UA NEGATIVE NEGATIVE mg/dL   Bilirubin Urine NEGATIVE NEGATIVE   Ketones, ur NEGATIVE NEGATIVE  mg/dL   Specific Gravity, Urine 1.029 1.005 - 1.030   Hgb urine dipstick NEGATIVE NEGATIVE   pH 5.0 5.0 - 8.0   Protein, ur NEGATIVE NEGATIVE mg/dL   Nitrite NEGATIVE NEGATIVE   Leukocytes, UA NEGATIVE NEGATIVE   RBC / HPF 0-5 0 - 5 RBC/hpf   WBC, UA 0-5 0 - 5 WBC/hpf   Bacteria, UA RARE (A) NONE SEEN   Squamous Epithelial / LPF 0-5 (A) NONE SEEN  Urine Drug Screen, Qualitative ([facility] only)     Status: Abnormal   Collection Time: 06/25/16 12:15 AM  Result Value Ref Range   Tricyclic, Ur Screen NONE DETECTED NONE DETECTED   Amphetamines, Ur Screen NONE DETECTED NONE DETECTED   MDMA (Ecstasy)Ur Screen NONE DETECTED NONE DETECTED   Cocaine Metabolite,Ur [place] NONE DETECTED NONE DETECTED   Opiate, Ur Screen POSITIVE (A) NONE DETECTED   Phencyclidine (PCP) Ur S NONE DETECTED NONE DETECTED   Cannabinoid 50 Ng, Ur [place] NONE DETECTED NONE DETECTED   Barbiturates, Ur Screen NONE DETECTED NONE DETECTED   Benzodiazepine, Ur Scrn NONE DETECTED NONE DETECTED   Methadone Scn, Ur NONE DETECTED NONE DETECTED    Comment: (NOTE) 254  Tricyclics, urine               Cutoff 1000 ng/mL 200  Amphetamines, urine             Cutoff 1000 ng/mL 300  MDMA (Ecstasy), urine           Cutoff 500 ng/mL 400  Cocaine Metabolite, urine       Cutoff 300 ng/mL 500  Opiate, urine                   Cutoff 300 ng/mL 600  Phencyclidine (PCP), urine      Cutoff 25 ng/mL 700  Cannabinoid, urine              Cutoff 50 ng/mL 800  Barbiturates, urine             Cutoff 200 ng/mL 900  Benzodiazepine, urine  Cutoff 200 ng/mL 1000 Methadone, urine                Cutoff 300 ng/mL 1100 1200 The urine drug screen provides only a preliminary, unconfirmed 1300 analytical test result and should not be used for non-medical 1400 purposes. Clinical consideration and professional judgment should 1500 be applied to any positive drug screen result due to possible 1600 interfering substances. A more specific alternate chemical  method 1700 must be used in order to obtain a confirmed analytical result.  1800 Gas chromato graphy / mass spectrometry (GC/MS) is the preferred 1900 confirmatory method.   Aerobic/Anaerobic Culture (surgical/deep wound)     Status: None (Preliminary result)   Collection Time: 06/25/16  4:54 AM  Result Value Ref Range   Specimen Description WOUND RIGHT FOOT    Special Requests Normal    Gram Stain      ABUNDANT WBC PRESENT,BOTH PMN AND MONONUCLEAR ABUNDANT GRAM POSITIVE COCCI IN CLUSTERS MODERATE GRAM POSITIVE COCCI IN CHAINS FEW GRAM VARIABLE ROD    Culture PENDING    Report Status PENDING   Glucose, capillary     Status: Abnormal   Collection Time: 06/25/16  8:20 AM  Result Value Ref Range   Glucose-Capillary 378 (H) 65 - 99 mg/dL  Basic metabolic panel     Status: Abnormal   Collection Time: 06/25/16  9:47 AM  Result Value Ref Range   Sodium 134 (L) 135 - 145 mmol/L   Potassium 3.8 3.5 - 5.1 mmol/L    Comment: HEMOLYSIS AT THIS LEVEL MAY AFFECT RESULT   Chloride 104 101 - 111 mmol/L   CO2 24 22 - 32 mmol/L   Glucose, Bld 381 (H) 65 - 99 mg/dL   BUN 27 (H) 6 - 20 mg/dL   Creatinine, Ser 2.03 (H) 0.61 - 1.24 mg/dL   Calcium 8.3 (L) 8.9 - 10.3 mg/dL   GFR calc non Af Amer 37 (L) >60 mL/min   GFR calc Af Amer 43 (L) >60 mL/min    Comment: (NOTE) The eGFR has been calculated using the CKD EPI equation. This calculation has not been validated in all clinical situations. eGFR's persistently <60 mL/min signify possible Chronic Kidney Disease.    Anion gap 6 5 - 15  Glucose, capillary     Status: Abnormal   Collection Time: 06/25/16  1:28 PM  Result Value Ref Range   Glucose-Capillary 329 (H) 65 - 99 mg/dL  Glucose, capillary     Status: Abnormal   Collection Time: 06/25/16  4:40 PM  Result Value Ref Range   Glucose-Capillary 331 (H) 65 - 99 mg/dL    Current Facility-Administered Medications  Medication Dose Route Frequency Provider Last Rate Last Dose  . 0.9 %   sodium chloride infusion   Intravenous Continuous Gladstone Lighter, MD 100 mL/hr at 06/25/16 0956    . acetaminophen (TYLENOL) tablet 650 mg  650 mg Oral Q6H PRN Harrie Foreman, MD       Or  . acetaminophen (TYLENOL) suppository 650 mg  650 mg Rectal Q6H PRN Harrie Foreman, MD      . atorvastatin (LIPITOR) tablet 40 mg  40 mg Oral q1800 Gladstone Lighter, MD   40 mg at 06/25/16 1637  . baclofen (LIORESAL) tablet 10 mg  10 mg Oral TID Gladstone Lighter, MD   10 mg at 06/25/16 1638  . buPROPion (WELLBUTRIN SR) 12 hr tablet 150 mg  150 mg Oral BID Gladstone Lighter, MD      .  carvedilol (COREG) tablet 25 mg  25 mg Oral BID WC Gladstone Lighter, MD   25 mg at 06/25/16 1638  . collagenase (SANTYL) ointment   Topical Daily Gladstone Lighter, MD      . DULoxetine (CYMBALTA) DR capsule 120 mg  120 mg Oral Daily Gladstone Lighter, MD   120 mg at 06/25/16 1643  . famotidine (PEPCID) tablet 20 mg  20 mg Oral QHS Gladstone Lighter, MD      . heparin injection 5,000 Units  5,000 Units Subcutaneous Q8H Gladstone Lighter, MD   5,000 Units at 06/25/16 1638  . HYDROcodone-acetaminophen (NORCO) 10-325 MG per tablet 1 tablet  1 tablet Oral Q6H PRN Gladstone Lighter, MD   1 tablet at 06/25/16 1638  . insulin aspart (novoLOG) injection 0-20 Units  0-20 Units Subcutaneous TID WC Harrie Foreman, MD   15 Units at 06/25/16 1742  . insulin aspart (novoLOG) injection 0-5 Units  0-5 Units Subcutaneous QHS Harrie Foreman, MD      . insulin aspart (novoLOG) injection 5 Units  5 Units Subcutaneous TID WC Gladstone Lighter, MD   5 Units at 06/25/16 1741  . insulin glargine (LANTUS) injection 28 Units  28 Units Subcutaneous BID Gladstone Lighter, MD      . OLANZapine (ZYPREXA) tablet 20 mg  20 mg Oral QHS Gladstone Lighter, MD      . ondansetron [facility]) tablet 4 mg  4 mg Oral Q6H PRN Harrie Foreman, MD       Or  . ondansetron [facility]) injection 4 mg  4 mg Intravenous Q6H PRN Harrie Foreman, MD      .  piperacillin-tazobactam (ZOSYN) IVPB 4.5 g  4.5 g Intravenous Q8H Harrie Foreman, MD   4.5 g at 06/25/16 1850  . [START ON 06/26/2016] pneumococcal 23 valent vaccine (PNU-IMMUNE) injection 0.5 mL  0.5 mL Intramuscular Tomorrow-1000 Gladstone Lighter, MD      . vancomycin (VANCOCIN) 1,500 mg in sodium chloride 0.9 % 500 mL IVPB  1,500 mg Intravenous Q12H Harrie Foreman, MD   1,500 mg at 06/25/16 1500    Musculoskeletal: Strength & Muscle Tone: within normal limits Gait & Station: normal Patient leans: N/A  Psychiatric Specialty Exam: Physical Exam  Nursing note and vitals reviewed. Constitutional: He appears well-developed and well-nourished.  HENT:  Head: Normocephalic and atraumatic.  Eyes: Conjunctivae are normal. Pupils are equal, round, and reactive to light.  Neck: Normal range of motion.  Cardiovascular: Normal heart sounds.   Respiratory: He is in respiratory distress.  GI: Soft.  Musculoskeletal: Normal range of motion.  Neurological: He is alert.  Skin: Skin is warm and dry.  Psychiatric: His affect is blunt. His speech is delayed. He is slowed. Thought content is paranoid. Cognition and memory are normal. He expresses impulsivity. He exhibits a depressed mood. He expresses suicidal ideation.    Review of Systems  Constitutional: Negative.   HENT: Negative.   Eyes: Negative.   Respiratory: Negative.   Cardiovascular: Negative.   Gastrointestinal: Negative.   Musculoskeletal: Negative.   Skin: Negative.   Neurological: Negative.   Psychiatric/Behavioral: Positive for depression, hallucinations and suicidal ideas. Negative for memory loss and substance abuse. The patient is nervous/anxious and has insomnia.     Blood pressure 130/83, pulse 89, temperature 98.1 F (36.7 C), temperature source Oral, resp. rate 20, height 6' 3" (1.905 m), weight 129.8 kg (286 lb 1.6 oz), SpO2 95 %.Body mass index is 35.76 kg/m.  General Appearance: Casual  Eye  Contact:  Minimal   Speech:  Slow  Volume:  Decreased  Mood:  Dysphoric  Affect:  Congruent  Thought Process:  Goal Directed  Orientation:  Full (Time, Place, and Person)  Thought Content:  Logical  Suicidal Thoughts:  Yes.  with intent/plan  Homicidal Thoughts:  No  Memory:  Immediate;   Good Recent;   Fair Remote;   Fair  Judgement:  Fair  Insight:  Fair  Psychomotor Activity:  Decreased  Concentration:  Concentration: Fair  Recall:  [employer] of Knowledge:  Fair  Language:  Fair  Akathisia:  No  Handed:  Right  AIMS (if indicated):     Assets:  Communication Skills Desire for Improvement Financial Resources/Insurance Housing Social Support  ADL's:  Intact  Cognition:  WNL  Sleep:        Treatment Plan Summary: Daily contact with patient to assess and evaluate symptoms and progress in treatment, Medication management and Plan Patient has been continued on his usual medication of Cymbalta and Zyprexa. Being monitored and treated for his renal insufficiency. Once he is medically cleared he can be transferred to the psychiatric service. Patient agrees to plan.  Disposition: Recommend psychiatric Inpatient admission when medically cleared.  Alethia Berthold, MD 06/25/2016 8:13 PM

## 2016-06-25 NOTE — Progress Notes (Signed)
Pharmacy Antibiotic Note  [name] is a 50 y.o. male admitted on 06/25/2016 with wound infection.  Pharmacy has been consulted for vancomycin and Zosyn dosing.  Plan: DW 103kg  Vd 73L Scr improved significantly since initial dosing. Will change to Vancomycin 1500 mg IV q12 hours.   Zosyn 4.5 grams q 8 hours ordered for TBW >120kg.  Height: 6\' 3"  (190.5 cm) Weight: 286 lb 1.6 oz (129.8 kg) IBW/kg (Calculated) : 84.5  Temp (24hrs), Avg:98.1 F (36.7 C), Min:98 F (36.7 C), Max:98.3 F (36.8 C)   Recent Labs Lab 06/24/16 2309 06/25/16 0947  WBC 7.3  --   CREATININE 2.75* 2.03*    Estimated Creatinine Clearance: 64.6 mL/min (by C-G formula based on SCr of 2.03 mg/dL).    Allergies  Allergen Reactions  . Erythromycin Base Other (See Comments)    Reaction: heart flutter  . Metformin And Related Hives and Other (See Comments)    Reaction: sweating  . Percocet [Oxycodone-Acetaminophen] Nausea And Vomiting    Antimicrobials this admission: vancomycin  >>  Zosyn  >>   Dose adjustments this admission:   Microbiology results: 8/28 wound cx pending  Thank you for allowing pharmacy to be a part of this patient's care.  [name] D 06/25/2016 10:46 AM

## 2016-06-25 NOTE — Care Management Note (Signed)
Case Management Note  Patient Details  Name: [name] MRN: [id number] Date of Birth: 24-Apr-1966  Subjective/Objective:    Spoke with patient and spouse concerning discharge planning. Patient is followed by [facility] for wound management.  Patient will have MRI today to determine course of medical treatment. Also pending psych evaluation after MRI. Patient is from home and normally independent. WIll continue to follow.              Action/Plan:Anticipated discharge is home with self care.   Expected Discharge Date:                  Expected Discharge Plan:  Home/Self Care  In-House Referral:     Discharge planning Services  CM Consult  Post Acute Care Choice:    Choice offered to:     DME Arranged:    DME Agency:     HH Arranged:    HH Agency:     Status of Service:  In process, will continue to follow  If discussed at Long Length of Stay Meetings, dates discussed:    Additional Comments:  Alvie Heidelberg, RN 06/25/2016, 12:02 PM

## 2016-06-25 NOTE — Progress Notes (Signed)
Inpatient Diabetes Program Recommendations  AACE/ADA: New Consensus Statement on Inpatient Glycemic Control (2015)  Target Ranges:  Prepandial:   less than 140 mg/dL      Peak postprandial:   less than 180 mg/dL (1-2 hours)      Critically ill patients:  140 - 180 mg/dL   Lab Results  Component Value Date   GLUCAP 378 (H) 06/25/2016   HGBA1C (H) 01/28/2011    12.9 (NOTE)                                                                       According to the ADA Clinical Practice Recommendations for 2011, when HbA1c is used as a screening test:   >=6.5%   Diagnostic of Diabetes Mellitus           (if abnormal result  is confirmed)  5.7-6.4%   Increased risk of developing Diabetes Mellitus  References:Diagnosis and Classification of Diabetes Mellitus,Diabetes [id number] 1):S62-S69 and Standards of Medical Care in         Diabetes - 2011,Diabetes Care,2011,34  (Suppl 1):S11-S61.    Review of Glycemic Control  Results for Joe, [name] (MRN [id number]) as of 06/25/2016 10:48  Ref. Range 06/25/2016 08:20  Glucose-Capillary Latest Ref Range: 65 - 99 mg/dL 378 (H)   Diabetes history: Type 2 Outpatient Diabetes medications: Levemir 45 units bid, Novolog 20-30 units tid with meals  Current orders for Inpatient glycemic control:  Levemir 25 units bid, Novolog 5 units tid with meals, Novolog 0-20 units tid, Novolog 0-5 units qhs  Inpatient Diabetes Program Recommendations:   Agree with current orders for blood sugar management  Gentry Fitz, RN, [place], [place], CDE Diabetes Coordinator Inpatient Diabetes Program  [phone] (Team Pager) [phone] ([place]) 06/25/2016 10:52 AM

## 2016-06-25 NOTE — Progress Notes (Signed)
°   06/25/16 0700  Clinical Encounter Type  Visited With Patient;Health care provider  Visit Type Initial;Psychological support  Referral From Nurse;Patient  Consult/Referral To Chaplain  Recommendations Follow-up on this patient  Spiritual Encounters  Spiritual Needs Emotional  Stress Factors  Patient Stress Factors None identified  Family Stress Factors None identified  Pt is struggling with suicidal ideation, stating that this was a persistent issue. Chaplain counseled with patient and affirmed his being proactive in seeking assistance. Pt has a strong family bond with his grandchildren and views them as primary in pursuing treatment.

## 2016-06-25 NOTE — Consult Note (Signed)
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  Wound closes and re-opens, per patient.  Currently not offloading pressure.  Wound type: Neuropathic ulcer, full thickness Pressure Ulcer POA: Yes Measurement:1 cm x 1 cm x 0.3 cm  Wound [id number] red, 25% devitalized tissue Drainage (amount, consistency, odor) Minimal serosanguinous.  No odor.  Periwound:Calloused, faint erythema, induration Dressing procedure/placement/frequency:Cleanse wound to right plantar foot with NS and pat gently dry.  Apply Santyl to wound bed.  Cover with NS moist gauze.  Secure with 4x4 gauze and kerlix/tape.  Change daily  Podiatry consult pending.  Will not follow at this time.  Please re-consult if needed.  Domenic Moras RN BSN [place] Pager [phone]

## 2016-06-25 NOTE — Progress Notes (Signed)
Shift assessment was completed by 0900. Pt has suicide sitter at bedside, c/o chronic back pain. See flowsheet. Since assessment, pt has had MRI of his R foot completed, wife has been in to visit, and home medication regimen has been reinstated. Podiatry assessed pt's R foot this evening, no new orders received. PIV#20 to [place] infiltrated after mri, this writer made two unsuccessful attempts to place new iv, new site to l hand placed by charge nurse, pt tolerated well, pt stated he has had picc line in the past due to poor iv access. This Probation officer changed dressing to pt's R foot, site appears as a hole below his great toe, cleaned with saline with brown drainage returned, saline applied until clear, then santyl dressing placed per md order. Wound edges appear brown. Pt stated that he has had is R foot healed over twice in the past. Of note, all of pt's FS were in the 300's despite insulin coverage given. Pt received pain medication per home regimen at approximately 1700, srx2, sitter at bedside, pt resting with eyes closed.

## 2016-06-26 ENCOUNTER — Inpatient Hospital Stay
Admission: EM | Admit: 2016-06-26 | Discharge: 2016-06-29 | DRG: 885 | Disposition: A | Discharge status: Home / Self Care | Payer: Medicaid Other | Source: Intra-hospital | Attending: Psychiatry | Admitting: Psychiatry

## 2016-06-26 ENCOUNTER — Encounter: Payer: Self-pay | Admitting: Psychiatry

## 2016-06-26 DIAGNOSIS — E1310 Other specified diabetes mellitus with ketoacidosis without coma: Secondary | ICD-10-CM | POA: Diagnosis present

## 2016-06-26 DIAGNOSIS — M869 Osteomyelitis, unspecified: Secondary | ICD-10-CM | POA: Diagnosis present

## 2016-06-26 DIAGNOSIS — G894 Chronic pain syndrome: Secondary | ICD-10-CM | POA: Diagnosis present

## 2016-06-26 DIAGNOSIS — K219 Gastro-esophageal reflux disease without esophagitis: Secondary | ICD-10-CM | POA: Diagnosis present

## 2016-06-26 DIAGNOSIS — E781 Pure hyperglyceridemia: Secondary | ICD-10-CM | POA: Diagnosis present

## 2016-06-26 DIAGNOSIS — Z89422 Acquired absence of other left toe(s): Secondary | ICD-10-CM | POA: Diagnosis not present

## 2016-06-26 DIAGNOSIS — R45851 Suicidal ideations: Secondary | ICD-10-CM

## 2016-06-26 DIAGNOSIS — F419 Anxiety disorder, unspecified: Secondary | ICD-10-CM | POA: Diagnosis present

## 2016-06-26 DIAGNOSIS — S91309A Unspecified open wound, unspecified foot, initial encounter: Secondary | ICD-10-CM | POA: Diagnosis present

## 2016-06-26 DIAGNOSIS — Z818 Family history of other mental and behavioral disorders: Secondary | ICD-10-CM | POA: Diagnosis not present

## 2016-06-26 DIAGNOSIS — F250 Schizoaffective disorder, bipolar type: Secondary | ICD-10-CM | POA: Diagnosis present

## 2016-06-26 DIAGNOSIS — Z79899 Other long term (current) drug therapy: Secondary | ICD-10-CM | POA: Diagnosis not present

## 2016-06-26 DIAGNOSIS — Z86718 Personal history of other venous thrombosis and embolism: Secondary | ICD-10-CM

## 2016-06-26 DIAGNOSIS — I129 Hypertensive chronic kidney disease with stage 1 through stage 4 chronic kidney disease, or unspecified chronic kidney disease: Secondary | ICD-10-CM | POA: Diagnosis present

## 2016-06-26 DIAGNOSIS — Z794 Long term (current) use of insulin: Secondary | ICD-10-CM

## 2016-06-26 DIAGNOSIS — N183 Chronic kidney disease, stage 3 (moderate): Secondary | ICD-10-CM | POA: Diagnosis present

## 2016-06-26 DIAGNOSIS — E1122 Type 2 diabetes mellitus with diabetic chronic kidney disease: Secondary | ICD-10-CM | POA: Diagnosis present

## 2016-06-26 DIAGNOSIS — Z885 Allergy status to narcotic agent status: Secondary | ICD-10-CM

## 2016-06-26 DIAGNOSIS — E785 Hyperlipidemia, unspecified: Secondary | ICD-10-CM | POA: Diagnosis present

## 2016-06-26 DIAGNOSIS — I10 Essential (primary) hypertension: Secondary | ICD-10-CM | POA: Diagnosis present

## 2016-06-26 DIAGNOSIS — Z9049 Acquired absence of other specified parts of digestive tract: Secondary | ICD-10-CM

## 2016-06-26 DIAGNOSIS — E1110 Type 2 diabetes mellitus with ketoacidosis without coma: Secondary | ICD-10-CM | POA: Diagnosis present

## 2016-06-26 LAB — GLUCOSE, CAPILLARY
GLUCOSE-CAPILLARY: 352 mg/dL — AB (ref 65–99)
Glucose-Capillary: 178 mg/dL — ABNORMAL HIGH (ref 65–99)
Glucose-Capillary: 199 mg/dL — ABNORMAL HIGH (ref 65–99)
Glucose-Capillary: 282 mg/dL — ABNORMAL HIGH (ref 65–99)

## 2016-06-26 LAB — BASIC METABOLIC PANEL
ANION GAP: 6 (ref 5–15)
BUN: 18 mg/dL (ref 6–20)
CALCIUM: 8 mg/dL — AB (ref 8.9–10.3)
CO2: 21 mmol/L — AB (ref 22–32)
CREATININE: 1.45 mg/dL — AB (ref 0.61–1.24)
Chloride: 110 mmol/L (ref 101–111)
GFR calc Af Amer: >60 mL/min (ref >60)
GFR, EST NON AFRICAN AMERICAN: 56 mL/min — AB (ref >60)
GLUCOSE: 177 mg/dL — AB (ref 65–99)
Potassium: 3.5 mmol/L (ref 3.5–5.1)
Sodium: 137 mmol/L (ref 135–145)

## 2016-06-26 LAB — CBC
HCT: 32.2 % — ABNORMAL LOW (ref 40.0–52.0)
HEMOGLOBIN: 11.7 g/dL — AB (ref 13.0–18.0)
MCH: 30.9 pg (ref 26.0–34.0)
MCHC: 36.4 g/dL — ABNORMAL HIGH (ref 32.0–36.0)
MCV: 85 fL (ref 80.0–100.0)
Platelets: 208 10*3/uL (ref 150–440)
RBC: 3.79 MIL/uL — ABNORMAL LOW (ref 4.40–5.90)
RDW: 13.2 % (ref 11.5–14.5)
WBC: 4.2 10*3/uL (ref 3.8–10.6)

## 2016-06-26 MED ORDER — DULOXETINE HCL 60 MG PO CPEP
120.0000 mg | ORAL_CAPSULE | Freq: Every day | ORAL | Status: DC
  Administered 2016-06-27 – 2016-06-29 (×3): 120 mg via ORAL
  Filled 2016-06-26 (×4): qty 2

## 2016-06-26 MED ORDER — CARVEDILOL 25 MG PO TABS
25.0000 mg | ORAL_TABLET | Freq: Two times a day (BID) | ORAL | Status: DC
  Administered 2016-06-26 – 2016-06-29 (×6): 25 mg via ORAL
  Filled 2016-06-26 (×6): qty 1

## 2016-06-26 MED ORDER — ACETAMINOPHEN 325 MG PO TABS: 650.0000 mg | ORAL_TABLET | Freq: Four times a day (QID) | ORAL | Status: DC | PRN

## 2016-06-26 MED ORDER — HYDROCODONE-ACETAMINOPHEN 10-325 MG PO TABS
1.0000 | ORAL_TABLET | Freq: Four times a day (QID) | ORAL | Status: DC | PRN
  Administered 2016-06-27: 1 via ORAL
  Filled 2016-06-26: qty 1

## 2016-06-26 MED ORDER — DOXYCYCLINE HYCLATE 100 MG PO TABS: 100.0000 mg | ORAL_TABLET | Freq: Two times a day (BID) | ORAL | Status: DC

## 2016-06-26 MED ORDER — CLONIDINE HCL 0.2 MG/24HR TD PTWK: 0.2000 mg | MEDICATED_PATCH | TRANSDERMAL | 12 refills | Status: AC

## 2016-06-26 MED ORDER — COLLAGENASE 250 UNIT/GM EX OINT: TOPICAL_OINTMENT | Freq: Every day | CUTANEOUS | 0 refills | Status: DC

## 2016-06-26 MED ORDER — DOXYCYCLINE HYCLATE 100 MG PO TABS
100.0000 mg | ORAL_TABLET | Freq: Two times a day (BID) | ORAL | Status: DC
  Administered 2016-06-26 – 2016-06-29 (×6): 100 mg via ORAL
  Filled 2016-06-26 (×7): qty 1

## 2016-06-26 MED ORDER — HYDROCODONE-ACETAMINOPHEN 10-325 MG PO TABS: 1.0000 | ORAL_TABLET | Freq: Four times a day (QID) | ORAL | 0 refills | Status: AC | PRN

## 2016-06-26 MED ORDER — ACETAMINOPHEN 650 MG RE SUPP
650.0000 mg | Freq: Four times a day (QID) | RECTAL | Status: DC | PRN
  Filled 2016-06-26: qty 1

## 2016-06-26 MED ORDER — ALUM & MAG HYDROXIDE-SIMETH 200-200-20 MG/5ML PO SUSP: 30.0000 mL | ORAL | Status: DC | PRN

## 2016-06-26 MED ORDER — ATORVASTATIN CALCIUM 20 MG PO TABS
40.0000 mg | ORAL_TABLET | Freq: Every day | ORAL | Status: DC
  Administered 2016-06-26 – 2016-06-28 (×3): 40 mg via ORAL
  Filled 2016-06-26 (×3): qty 2

## 2016-06-26 MED ORDER — INSULIN ASPART 100 UNIT/ML ~~LOC~~ SOLN
0.0000 [IU] | Freq: Three times a day (TID) | SUBCUTANEOUS | Status: DC
  Administered 2016-06-26: 20 [IU] via SUBCUTANEOUS
  Administered 2016-06-27: 5 [IU] via SUBCUTANEOUS
  Administered 2016-06-27 – 2016-06-28 (×4): 7 [IU] via SUBCUTANEOUS
  Administered 2016-06-29: 11 [IU] via SUBCUTANEOUS
  Administered 2016-06-29: 3 [IU] via SUBCUTANEOUS
  Filled 2016-06-26: qty 4
  Filled 2016-06-26 (×2): qty 7
  Filled 2016-06-26: qty 12
  Filled 2016-06-26: qty 11
  Filled 2016-06-26: qty 7
  Filled 2016-06-26: qty 20
  Filled 2016-06-26: qty 9
  Filled 2016-06-26: qty 4
  Filled 2016-06-26 (×2): qty 7
  Filled 2016-06-26: qty 3

## 2016-06-26 MED ORDER — INSULIN ASPART 100 UNIT/ML ~~LOC~~ SOLN: 5.0000 [IU] | Freq: Three times a day (TID) | SUBCUTANEOUS | 11 refills | Status: DC

## 2016-06-26 MED ORDER — INSULIN ASPART 100 UNIT/ML ~~LOC~~ SOLN
0.0000 [IU] | Freq: Every day | SUBCUTANEOUS | Status: DC
  Administered 2016-06-26: 3 [IU] via SUBCUTANEOUS
  Administered 2016-06-27: 2 [IU] via SUBCUTANEOUS
  Filled 2016-06-26: qty 2
  Filled 2016-06-26: qty 3
  Filled 2016-06-26: qty 5

## 2016-06-26 MED ORDER — RIVAROXABAN 10 MG PO TABS: 20.0000 mg | ORAL_TABLET | Freq: Every day | ORAL | Status: DC

## 2016-06-26 MED ORDER — INSULIN ASPART 100 UNIT/ML ~~LOC~~ SOLN
5.0000 [IU] | Freq: Three times a day (TID) | SUBCUTANEOUS | Status: DC
  Administered 2016-06-26 – 2016-06-29 (×8): 5 [IU] via SUBCUTANEOUS
  Filled 2016-06-26 (×3): qty 5

## 2016-06-26 MED ORDER — FAMOTIDINE 20 MG PO TABS
20.0000 mg | ORAL_TABLET | Freq: Every day | ORAL | Status: DC
  Administered 2016-06-26 – 2016-06-28 (×3): 20 mg via ORAL
  Filled 2016-06-26 (×3): qty 1

## 2016-06-26 MED ORDER — MAGNESIUM HYDROXIDE 400 MG/5ML PO SUSP: 30.0000 mL | Freq: Every day | ORAL | Status: DC | PRN

## 2016-06-26 MED ORDER — OLANZAPINE 10 MG PO TABS
20.0000 mg | ORAL_TABLET | Freq: Every day | ORAL | Status: DC
  Administered 2016-06-26 – 2016-06-28 (×3): 20 mg via ORAL
  Filled 2016-06-26 (×3): qty 2

## 2016-06-26 MED ORDER — INSULIN GLARGINE 100 UNIT/ML ~~LOC~~ SOLN
28.0000 [IU] | Freq: Two times a day (BID) | SUBCUTANEOUS | Status: DC
  Administered 2016-06-26: 28 [IU] via SUBCUTANEOUS
  Filled 2016-06-26 (×2): qty 0.28

## 2016-06-26 MED ORDER — CLONIDINE HCL 0.2 MG/24HR TD PTWK
0.2000 mg | MEDICATED_PATCH | TRANSDERMAL | Status: DC
  Administered 2016-06-26: 0.2 mg via TRANSDERMAL
  Filled 2016-06-26: qty 1

## 2016-06-26 MED ORDER — COLLAGENASE 250 UNIT/GM EX OINT
TOPICAL_OINTMENT | Freq: Every day | CUTANEOUS | Status: DC
  Administered 2016-06-27: 12:00:00 via TOPICAL
  Filled 2016-06-26: qty 30

## 2016-06-26 MED ORDER — BACLOFEN 10 MG PO TABS
10.0000 mg | ORAL_TABLET | Freq: Three times a day (TID) | ORAL | Status: DC
  Administered 2016-06-26 – 2016-06-29 (×9): 10 mg via ORAL
  Filled 2016-06-26 (×9): qty 1

## 2016-06-26 MED ORDER — BUPROPION HCL ER (SR) 150 MG PO TB12
150.0000 mg | ORAL_TABLET | Freq: Two times a day (BID) | ORAL | Status: DC
  Administered 2016-06-26 – 2016-06-29 (×6): 150 mg via ORAL
  Filled 2016-06-26 (×6): qty 1

## 2016-06-26 MED ORDER — INSULIN GLARGINE 100 UNITS/ML SOLOSTAR PEN: 30.0000 [IU] | PEN_INJECTOR | Freq: Two times a day (BID) | SUBCUTANEOUS | 11 refills | Status: AC

## 2016-06-26 MED ORDER — CLONIDINE HCL 0.2 MG/24HR TD PTWK: 0.2000 mg | MEDICATED_PATCH | TRANSDERMAL | Status: DC

## 2016-06-26 MED ORDER — AMLODIPINE BESYLATE 5 MG PO TABS: 5.0000 mg | ORAL_TABLET | Freq: Every day | ORAL | 2 refills | Status: DC

## 2016-06-26 MED ORDER — DOXYCYCLINE HYCLATE 100 MG PO TABS: 100.0000 mg | ORAL_TABLET | Freq: Two times a day (BID) | ORAL | 0 refills | Status: DC

## 2016-06-26 MED ORDER — RIVAROXABAN 20 MG PO TABS
20.0000 mg | ORAL_TABLET | Freq: Every day | ORAL | Status: DC
  Administered 2016-06-26 – 2016-06-28 (×3): 20 mg via ORAL
  Filled 2016-06-26 (×4): qty 1

## 2016-06-26 MED ORDER — INSULIN GLARGINE 100 UNIT/ML ~~LOC~~ SOLN
30.0000 [IU] | Freq: Two times a day (BID) | SUBCUTANEOUS | Status: DC
  Administered 2016-06-27 – 2016-06-29 (×5): 30 [IU] via SUBCUTANEOUS
  Filled 2016-06-26 (×6): qty 0.3

## 2016-06-26 NOTE — Progress Notes (Signed)
Patient with depressed affect, cooperative behavior with admission interview and admission assessment. Skin check with no wounds or bruises. Skin check with no contraband found. States he is depressed with no SI/HI at this time. Dressing to foot clean dry and intact. Provided patient with toiletries and towels and patient states he will shower this evening and requests dressing change at that time. Santynyl ointment in med room. Patient blood glucose monitored and recorded with insulin as ordered. Safety maintained.

## 2016-06-26 NOTE — Care Management (Cosign Needed)
Plan at this time is for patient to transfer to inpatient phychiatric unit when medically stable.

## 2016-06-26 NOTE — Discharge Summary (Signed)
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) [E11.69, M86.9]  DISCHARGE DIAGNOSIS:   Principal Problem:   Schizoaffective disorder, bipolar type ([place]) Active Problems:   Acute on chronic kidney failure ([place])   Suicidal ideation   SECONDARY DIAGNOSIS:   Past Medical History:  Diagnosis Date  . Chronic kidney disease (CKD), stage III (moderate)    Cr was 2.15 (02/2016)  . Chronic pain   . Diabetes mellitus    insulin-dependent  . Hyperlipemia   . Hypertension   . Schizophrenia [facility])     HOSPITAL COURSE:   50 year old male with past medical history significant for CK D stage III baseline creatinine around 2, insulin-dependent diabetes mellitus, hyperlipidemia and bipolar disorder with depression presents to the hospital secondary to suicidal thoughts. Medical admission requested for acute on chronic renal failure and possible osteomyelitis of right fifth metatarsal.  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 -MRI showing chronic inflammatory changes from prior procedure. Appreciate podiatry consult. -Cultures growing gram-positive cocci. Was On vancomycin and Zosyn. Change to doxycycline for 7 days. -Dressing changes as recommended by podiatry -Topical antibiotic and use boot -No indication for any  procedure  #2 acute on chronic kidney disease-Baseline creatinine seems to be around 2, likely ATN from infection. -Improved renal function with IV fluids. Creatinine at 1.4  #3 uncontrolled diabetes mellitus-A1c of 13.9 - Restarted Lantus.  - Also NovoLog added -Sugars are better today  #4 major depression with suicidal ideation- continues to be depressed. Wife worried about suicidal thoughts. -prior history of depression and homicidal ideation with behavioral medicine admission in the past. -Continue suicide precautions. Sitter at bedside. Psychiatry consult appreciated. -Restarted all his antidepressants. 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  #6 hypertension-continue Coreg, clonidine patch and Norvasc  Medically stable for transfer to psychiatry unit today.  DISCHARGE CONDITIONS:   Guarded  CONSULTS OBTAINED:   Treatment Team:  Joe Lex, MD [name], DPM  DRUG ALLERGIES:   Allergies  Allergen Reactions  . Erythromycin Base Other (See Comments)    Reaction: heart flutter  . Metformin And Related Hives and Other (See Comments)    Reaction: sweating  . Percocet [Oxycodone-Acetaminophen] Nausea And Vomiting   DISCHARGE MEDICATIONS:     Medication List    STOP taking these medications   amlodipine-atorvastatin 10-10 MG tablet Commonly known as:  CADUET   hydrochlorothiazide 25 MG tablet Commonly known as:  HYDRODIURIL     TAKE these medications   amLODipine 5 MG tablet Commonly known as:  NORVASC Take 1 tablet (5 mg total) by mouth daily.   atorvastatin 40 MG tablet Commonly known as:  LIPITOR Take 40 mg by mouth daily.   baclofen 10 MG tablet Commonly known as:  LIORESAL Take 10 mg by mouth 3 (three) times daily.   buPROPion 150 MG 12 hr  tablet Commonly known as:  ZYBAN Take 150 mg by mouth 2 (two) times daily.   carvedilol 25 MG tablet Commonly  known as:  COREG Take 25 mg by mouth 2 (two) times daily with a meal.   cloNIDine 0.2 mg/24hr patch Commonly known as:  CATAPRES - Dosed in mg/24 hr Place 1 patch (0.2 mg total) onto the skin once a week.   collagenase ointment Commonly known as:  SANTYL Apply topically daily. Topical, Daily, First dose on Mon 06/25/16 at 1230, For 14 doses :Cleanse wound to right plantar foot with NS and pat gently dry.Apply Santyl to wound bed.Cover with NS moist gauze.Secure with 4x4 gauze and kerlix/tape.Change daily.   doxycycline 100 MG tablet Commonly known as:  VIBRA-TABS Take 1 tablet (100 mg total) by mouth every 12 (twelve) hours. X 7 days   DULoxetine 60 MG capsule Commonly known as:  CYMBALTA Take 120 mg by mouth daily.   famotidine 20 MG tablet Commonly known as:  PEPCID Take 20 mg by mouth at bedtime.   HYDROcodone-acetaminophen 10-325 MG tablet Commonly known as:  NORCO Take 1 tablet by mouth every 6 (six) hours as needed. What changed:  how much to take   insulin aspart 100 UNIT/ML injection Commonly known as:  novoLOG Inject 5 Units into the skin 3 (three) times daily with meals. What changed:  how much to take  when to take this   insulin glargine 100 unit/mL Sopn Commonly known as:  LANTUS Inject 0.3 mLs (30 Units total) into the skin 2 (two) times daily. What changed:  how much to take   OLANZapine zydis 20 MG disintegrating tablet Commonly known as:  ZYPREXA Take 20 mg by mouth at bedtime.   OLANZapine zydis 5 MG disintegrating tablet Commonly known as:  ZYPREXA Take 5 mg by mouth at bedtime.   rivaroxaban 20 MG Tabs tablet Commonly known as:  XARELTO Take 20 mg by mouth daily with supper.        DISCHARGE INSTRUCTIONS:   Being discharged to Behavioural medicine unit  DIET:   Cardiac diet and Diabetic diet  ACTIVITY:   Activity as tolerated  OXYGEN:   Home Oxygen: No.  Oxygen Delivery: room air  DISCHARGE LOCATION:   Transfer to  Behavioural medicine Unit  If you experience worsening of your admission symptoms, develop shortness of breath, life threatening emergency, suicidal or homicidal thoughts you must seek medical attention immediately by calling 911 or calling your MD immediately  if symptoms less severe.  You Must read complete instructions/literature along with all the possible adverse reactions/side effects for all the Medicines you take and that have been prescribed to you. Take any new Medicines after you have completely understood and accpet all the possible adverse reactions/side effects.   Please note  You were cared for by a hospitalist during your hospital stay. If you have any questions about your discharge medications or the care you received while you were in the hospital after you are discharged, you can call the unit and asked to speak with the hospitalist on call if the hospitalist that took care of you is not available. Once you are discharged, your primary care physician will handle any further medical issues. Please note that NO REFILLS for any discharge medications will be authorized once you are discharged, as it is imperative that you return to your primary care physician (or establish a relationship with a primary care physician if you do not have one) for your aftercare needs  so that they can reassess your need for medications and monitor your lab values.    On the day of Discharge:  VITAL SIGNS:   Blood pressure (!) 144/89, pulse 80, temperature 97.8 F (36.6 C), temperature source Oral, resp. rate 16, height 6\' 3"  (1.905 m), weight 130.6 kg (288 lb), SpO2 100 %.  PHYSICAL EXAMINATION:    GENERAL:  50 y.o.-year-old patient lying in the bed with no acute distress.  EYES: Pupils equal, round, reactive to light and accommodation. No scleral icterus. Extraocular muscles intact.  HEENT: Head atraumatic, normocephalic. Oropharynx and nasopharynx clear.  NECK:  Supple, no jugular venous  distention. No thyroid enlargement, no tenderness.  LUNGS: Normal breath sounds bilaterally, no wheezing, rales,rhonchi or crepitation. No use of accessory muscles of respiration.  CARDIOVASCULAR: S1, S2 normal. No murmurs, rubs, or gallops.  ABDOMEN: Soft, nontender, nondistended. Bowel sounds present. No organomegaly or mass.  EXTREMITIES: No cyanosis, or clubbing. 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 Muscle strength 5/5 in all extremities. Sensation intact. Gait not checked.  PSYCHIATRIC: The patient is alert and oriented x 3. Depressed. active suicidal ideation, sitter at bedside. SKIN: No obvious rash, lesion, or ulcer.   DATA REVIEW:   CBC  Recent Labs Lab 06/26/16 0535  WBC 4.2  HGB 11.7*  HCT 32.2*  PLT 208    Chemistries   Recent Labs Lab 06/24/16 2309  06/26/16 0535  NA 132*  < > 137  K 3.2*  < > 3.5  CL 98*  < > 110  CO2 22  < > 21*  GLUCOSE 311*  < > 177*  BUN 28*  < > 18  CREATININE 2.75*  < > 1.45*  CALCIUM 9.2  < > 8.0*  AST 22  --   --   ALT 13*  --   --   ALKPHOS 111  --   --   BILITOT 0.4  --   --   < > = values in this interval not displayed.   Microbiology Results  Results for orders placed or performed during the hospital encounter of 06/25/16  Aerobic/Anaerobic Culture (surgical/deep wound)     Status: None (Preliminary result)   Collection Time: 06/25/16  4:54 AM  Result Value Ref Range Status   Specimen Description WOUND RIGHT FOOT  Final   Special Requests Normal  Final   Gram Stain   Final    ABUNDANT WBC PRESENT,BOTH PMN AND MONONUCLEAR ABUNDANT GRAM POSITIVE COCCI IN CLUSTERS MODERATE GRAM POSITIVE COCCI IN CHAINS FEW GRAM VARIABLE ROD    Culture   Final    CULTURE REINCUBATED FOR BETTER GROWTH Performed at [facility]    Report Status PENDING  Incomplete    RADIOLOGY:  No results found.   Management  plans discussed with the patient, family and they are in agreement.  CODE STATUS:     Code Status Orders        Start     Ordered   06/25/16 [id number]  Full code  Continuous     06/25/16 0606    Code Status History    Date Active Date Inactive Code Status Order ID Comments User Context   06/25/2016  6:06 AM 06/26/2016  3:06 PM Full Code [id number]  Joe Foreman, MD Inpatient    Advance Directive Documentation   Flowsheet Row Most Recent Value  Type of Advance Directive  Healthcare Power of Attorney  Pre-existing out of facility DNR order (yellow form or pink MOST form)  No data  "MOST" Form in Place?  No data      TOTAL TIME TAKING CARE OF THIS PATIENT: 38 minutes.    Shanaya Schneck M.D on 06/26/2016 at 3:58 PM  Between 7am to 6pm - Pager - [phone]  After 6pm go to [email or web address] - Proofreader  [facility]  Office  [phone]  CC: Primary care physician; Joe Bickers, MD   Note: This dictation was prepared with Dragon dictation along with smaller phrase technology. Any transcriptional errors that result from this process are unintentional.

## 2016-06-26 NOTE — BHH Suicide Risk Assessment (Deleted)
[facility] Admission Suicide Risk Assessment   Nursing information obtained from:    Demographic factors:    Current Mental Status:    Loss Factors:    Historical Factors:    Risk Reduction Factors:     Total Time spent with patient: 1 hour Principal Problem: Schizoaffective disorder, bipolar type ([place]) Diagnosis:   Patient Active Problem List   Diagnosis Date Noted  . DM (diabetes mellitus) type 2, uncontrolled, with ketoacidosis ([place]) [E13.10] 06/26/2016  . Dyslipidemia [E78.5] 06/26/2016  . Essential hypertension [I10] 06/26/2016  . Osteomyelitis ([place]) [M86.9] 06/26/2016  . GERD (gastroesophageal reflux disease) [K21.9] 06/26/2016  . Chronic pain syndrome [G89.4] 06/26/2016  . Acute on chronic kidney failure ([place]) [N17.9, N18.9] 06/25/2016  . Schizoaffective disorder, bipolar type ([place]) [F25.0] 06/25/2016  . Suicidal ideation [R45.851] 06/25/2016   Subjective Data: depression, suicidal ideation.  Continued Clinical Symptoms:  Alcohol Use Disorder Identification Test Final Score (AUDIT): 0 The "Alcohol Use Disorders Identification Test", Guidelines for Use in Primary Care, Second Edition.  World Pharmacologist [facility]). Score between 0-7:  no or low risk or alcohol related problems. Score between 8-15:  moderate risk of alcohol related problems. Score between 16-19:  high risk of alcohol related problems. Score 20 or above:  warrants further diagnostic evaluation for alcohol dependence and treatment.   CLINICAL FACTORS:   Bipolar Disorder:   Depressive phase Depression:   Severe Schizophrenia:   Depressive state   Musculoskeletal: Strength & Muscle Tone: within normal limits Gait & Station: unsteady Patient leans: N/A  Psychiatric Specialty Exam: Physical Exam  Nursing note and vitals reviewed.   Review of Systems  Musculoskeletal: Positive for back pain and joint pain.  Psychiatric/Behavioral: Positive for depression and suicidal ideas.  All other systems reviewed  and are negative.   Blood pressure 137/87, pulse 87, temperature 98.4 F (36.9 C), temperature source Oral, resp. rate 16, height 6\' 3"  (1.905 m), weight 128.8 kg (284 lb), SpO2 99 %.Body mass index is 35.5 kg/m.  General Appearance: Casual  Eye Contact:  Good  Speech:  Clear and Coherent  Volume:  Normal  Mood:  Depressed and Hopeless  Affect:  Blunt  Thought Process:  Goal Directed  Orientation:  Full (Time, Place, and Person)  Thought Content:  WDL  Suicidal Thoughts:  Yes.  with intent/plan  Homicidal Thoughts:  No  Memory:  Immediate;   Fair Recent;   Fair Remote;   Fair  Judgement:  Fair  Insight:  Fair  Psychomotor Activity:  Psychomotor Retardation  Concentration:  Concentration: Fair and Attention Span: Fair  Recall:  [employer] of Knowledge:  Fair  Language:  Fair  Akathisia:  No  Handed:  Right  AIMS (if indicated):     Assets:  Communication Skills Desire for Improvement Financial Resources/Insurance Housing Intimacy Resilience Social Support  ADL's:  Intact  Cognition:  WNL  Sleep:         COGNITIVE FEATURES THAT CONTRIBUTE TO RISK:  None    SUICIDE RISK:   Moderate:  Frequent suicidal ideation with limited intensity, and duration, some specificity in terms of plans, no associated intent, good self-control, limited dysphoria/symptomatology, some risk factors present, and identifiable protective factors, including available and accessible social support.   PLAN OF CARE: hospital admission, medication management, discharge planning.  [name] is a 50 year old male with a history of schizoaffective disorder and multiple medical problems transfer from medical floor where he was hospitalized for acute on chronic kidney disease to treat depression  and suicidal ideation.  1. Suicidal ideation. The patient is able to contract for safety in the hospital.  2. Mood. We will continue Zyprexa, Cymbalta and Wellbutrin for depression and mood stabilization. We  will start lamictal.  3. Chronic pain. He is on Oxycodone and Baclofen.  4. Diabetes. He is on Lantus, Novolog, ADA diet and SSI. Input from Diabetes Nurse Coordinators is greatly appreciated.  5. Dyslipidemia. He is on Lipitor.  6. GERD. He is on Pepcid.  7. HTN. He is on Coreg and Clonidine.  8. Osteomielitis/foot wound. We will continue Vibramycin and wound care.  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  10. Disposition. He will be discharged to home with his wife. He will ollow up with [facility].  I certify that inpatient services furnished can reasonably be expected to improve the patient's condition.  Orson Slick, MD 06/26/2016, 6:03 PM

## 2016-06-26 NOTE — Tx Team (Signed)
Initial Treatment Plan 06/26/2016 4:14 PM DOANE PLUDE [id number]    PATIENT STRESSORS: Health problems Medication change or noncompliance   PATIENT STRENGTHS: Capable of independent living Communication skills   PATIENT IDENTIFIED PROBLEMS: Depression   Suicidal ideas (prior to admit)                   DISCHARGE CRITERIA:  Adequate post-discharge living arrangements Improved stabilization in mood, thinking, and/or behavior Motivation to continue treatment in a less acute level of care  PRELIMINARY DISCHARGE PLAN: Attend aftercare/continuing care group Return to previous living arrangement  PATIENT/FAMILY INVOLVEMENT: This treatment plan has been presented to and reviewed with the patient, [name], and/or family member, .  The patient and family have been given the opportunity to ask questions and make suggestions.  Raul Del, RN 06/26/2016, 4:14 PM

## 2016-06-26 NOTE — Progress Notes (Signed)
Shift assessment completed at 0830, pt denied pain at that time, in no distress. Sitter remains at bedside. Pt fs was 178, pt received novolog coverage for this. PIV#22 was intact to l hand with iv ns infusing, site free of redness and swelling. This Probation officer changed pt's dressing to his R foot, site is clean in appearance, flushed with saline with no drainage returned, santyl applied with clean gauze. Dr. Derek Mound in on rounds at approx 1300, spoke to both pt and his wife regarding plan of care and d/c from this unit to psych unit for depression treatment. This writer removed PIV from pt with catheter intact at 1410, pt tolerated well. D/c instructions were reviewed briefly with pt and he signed paperwork. Pt left the unit with security escort at 1525 to room 309 in behavioral unit. Prior to this, this Probation officer gave report to [employer], Therapist, sports.

## 2016-06-27 DIAGNOSIS — F250 Schizoaffective disorder, bipolar type: Principal | ICD-10-CM

## 2016-06-27 LAB — GLUCOSE, CAPILLARY
GLUCOSE-CAPILLARY: 211 mg/dL — AB (ref 65–99)
GLUCOSE-CAPILLARY: 158 mg/dL — AB (ref 65–99)
Glucose-Capillary: 236 mg/dL — ABNORMAL HIGH (ref 65–99)
Glucose-Capillary: 218 mg/dL — ABNORMAL HIGH (ref 65–99)

## 2016-06-27 MED ORDER — LAMOTRIGINE 25 MG PO TABS
25.0000 mg | ORAL_TABLET | Freq: Every day | ORAL | Status: DC
  Administered 2016-06-27 – 2016-06-28 (×2): 25 mg via ORAL
  Filled 2016-06-27 (×2): qty 1

## 2016-06-27 MED ORDER — HYDROCODONE-ACETAMINOPHEN 10-325 MG PO TABS
2.0000 | ORAL_TABLET | Freq: Four times a day (QID) | ORAL | Status: DC | PRN
  Administered 2016-06-27 – 2016-06-29 (×5): 2 via ORAL
  Filled 2016-06-27 (×5): qty 2

## 2016-06-27 MED ORDER — TRAZODONE HCL 100 MG PO TABS
100.0000 mg | ORAL_TABLET | Freq: Every day | ORAL | Status: DC
  Administered 2016-06-27 – 2016-06-28 (×2): 100 mg via ORAL
  Filled 2016-06-27 (×2): qty 1

## 2016-06-27 NOTE — Plan of Care (Signed)
Problem: Activity: Goal: Interest or engagement in activities will improve Outcome: Progressing Pt isolated to room this shift. Pt only came out for medications when prompted.

## 2016-06-27 NOTE — BHH Group Notes (Signed)
[facility] Group Notes:  (Nursing/MHT/Case Management/Adjunct)  Date:  06/27/2016  Time:  4:46 AM  Type of Therapy:  Psychoeducational Skills  Participation Level:  Did Not Attend    Summary of Progress/Problems:  [name] 06/27/2016, 4:46 AM

## 2016-06-27 NOTE — BHH Suicide Risk Assessment (Signed)
[facility] Admission Suicide Risk Assessment   Nursing information obtained from:    Demographic factors:    Current Mental Status:    Loss Factors:    Historical Factors:    Risk Reduction Factors:     Total Time spent with patient: 1 hour Principal Problem: Schizoaffective disorder, bipolar type ([place]) Diagnosis:   Patient Active Problem List   Diagnosis Date Noted  . DM (diabetes mellitus) type 2, uncontrolled, with ketoacidosis ([place]) [E13.10] 06/26/2016  . Dyslipidemia [E78.5] 06/26/2016  . Essential hypertension [I10] 06/26/2016  . Osteomyelitis ([place]) [M86.9] 06/26/2016  . GERD (gastroesophageal reflux disease) [K21.9] 06/26/2016  . Chronic pain syndrome [G89.4] 06/26/2016  . Acute on chronic kidney failure ([place]) [N17.9, N18.9] 06/25/2016  . Schizoaffective disorder, bipolar type ([place]) [F25.0] 06/25/2016  . Suicidal ideation [R45.851] 06/25/2016   Subjective Data: depression, suicidal ideation.  Continued Clinical Symptoms:  Alcohol Use Disorder Identification Test Final Score (AUDIT): 0 The "Alcohol Use Disorders Identification Test", Guidelines for Use in Primary Care, Second Edition.  World Pharmacologist [facility]). Score between 0-7:  no or low risk or alcohol related problems. Score between 8-15:  moderate risk of alcohol related problems. Score between 16-19:  high risk of alcohol related problems. Score 20 or above:  warrants further diagnostic evaluation for alcohol dependence and treatment.   CLINICAL FACTORS:   Bipolar Disorder:   Depressive phase Depression:   Hopelessness Severe Obsessive-Compulsive Disorder Chronic Pain Medical Diagnoses and Treatments/Surgeries   Musculoskeletal: Strength & Muscle Tone: within normal limits Gait & Station: normal Patient leans: N/A  Psychiatric Specialty Exam: Physical Exam  Nursing note and vitals reviewed.   Review of Systems  Musculoskeletal: Positive for back pain and joint pain.  Psychiatric/Behavioral:  Positive for depression and suicidal ideas.  All other systems reviewed and are negative.   Blood pressure (!) 185/112, pulse 77, temperature 98.4 F (36.9 C), temperature source Oral, resp. rate 18, height 6\' 3"  (1.905 m), weight 128.8 kg (284 lb), SpO2 99 %.Body mass index is 35.5 kg/m.  General Appearance: Casual  Eye Contact:  Good  Speech:  Clear and Coherent  Volume:  Normal  Mood:  Depressed and Hopeless  Affect:  Blunt  Thought Process:  Goal Directed  Orientation:  Full (Time, Place, and Person)  Thought Content:  WDL  Suicidal Thoughts:  Yes.  with intent/plan  Homicidal Thoughts:  No  Memory:  Immediate;   Fair Recent;   Fair Remote;   Fair  Judgement:  Fair  Insight:  Fair  Psychomotor Activity:  Psychomotor Retardation  Concentration:  Concentration: Fair and Attention Span: Fair  Recall:  [employer] of Knowledge:  Fair  Language:  Fair  Akathisia:  No  Handed:  Right  AIMS (if indicated):     Assets:  Communication Skills Desire for Improvement Financial Resources/Insurance Housing Intimacy Resilience Social Support  ADL's:  Intact  Cognition:  WNL  Sleep:  Number of Hours: 7.75      COGNITIVE FEATURES THAT CONTRIBUTE TO RISK:  None    SUICIDE RISK:   Moderate:  Frequent suicidal ideation with limited intensity, and duration, some specificity in terms of plans, no associated intent, good self-control, limited dysphoria/symptomatology, some risk factors present, and identifiable protective factors, including available and accessible social support.   PLAN OF CARE: Hospital admission, medication management, discharge planning.  [name] is a 50 year old male with a history of schizoaffective disorder and multiple medical problems transfer from medical floor where he was hospitalized for acute  on chronic kidney disease to treat depression and suicidal ideation.  1. Suicidal ideation. The patient is able to contract for safety in the  hospital.  2. Mood. We will continue Zyprexa, Cymbalta and Wellbutrin for depression and mood stabilization. We will add Lamictal.  3. Chronic pain. He is on Oxycodone and Baclofen.  4. Diabetes. He is on Lantus, Novolog, ADA diet and SSI. Input from Diabetes Nurse Coordinators is greatly appreciated.  5. Dyslipidemia. He is on Lipitor.  6. GERD. He is on Pepcid.  7. HTN. He is on Coreg and Clonidine.  8. Osteomielitis/foot wound. We will continue Vibramycin and wound care.  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  10. Metabolic syndrome monitoring. The profile and hemoglobin A1c are pending.   11. Disposition. He will be discharged to home with his wife. He will ollow up with [facility].  I certify that inpatient services furnished can reasonably be expected to improve the patient's condition.  Orson Slick, MD 06/27/2016, 1:28 PM

## 2016-06-27 NOTE — Progress Notes (Signed)
D: Observed pt in room sleeping on bed. Patient alert and oriented x4. Patient endorses passive SI, no plan, and pt verbally contracts for safety. Pt endorsed AH "they tell me to hurt myself", but pt said voices "come and go." Pt rated depression 6/10 and denied anxiety. Pt stated his depression is greater than normal and could not identify a trigger. Pt indicated he "slept all day." A: Offered active listening and support. Provided therapeutic communication. Administered scheduled medications. Encouraged pt to attend groups and actively participate in care.  R: Pt pleasant and cooperative. Pt medication compliant. Will continue Q15 min. checks. Safety maintained.

## 2016-06-27 NOTE — Plan of Care (Signed)
Problem: Activity: Goal: Interest or engagement in activities will improve Outcome: Not Progressing Patient remains with depressed affect. Minimal interaction with peers. Therapy groups encouraged, patient states he does not want to be around a lot of people.

## 2016-06-27 NOTE — H&P (Signed)
Psychiatric Admission Assessment Adult  Patient Identification: [name] MRN:  [id number] Date of Evaluation:  06/27/2016 Chief Complaint:  Depression Principal Diagnosis: Schizoaffective disorder, bipolar type ([place]) Diagnosis:   Patient Active Problem List   Diagnosis Date Noted  . DM (diabetes mellitus) type 2, uncontrolled, with ketoacidosis ([place]) [E13.10] 06/26/2016  . Dyslipidemia [E78.5] 06/26/2016  . Essential hypertension [I10] 06/26/2016  . Osteomyelitis ([place]) [M86.9] 06/26/2016  . GERD (gastroesophageal reflux disease) [K21.9] 06/26/2016  . Chronic pain syndrome [G89.4] 06/26/2016  . Acute on chronic kidney failure ([place]) [N17.9, N18.9] 06/25/2016  . Schizoaffective disorder, bipolar type ([place]) [F25.0] 06/25/2016  . Suicidal ideation [R45.851] 06/25/2016   History of Present Illness:   Identifying data. [name] is a 50 year old male with a history of schizoaffective disorder and chronic pain.  Chief complaint. "I'm suicidal."  History of present illness. Information was obtained from the patient and the chart. The patient has a long history of depression and mood instability. He has been maintained on a combination of Cymbalta, Wellbutrin, and Zyprexa with success prescribed by [facility] step clinic. In the past several weeks however the patient became increasingly depressed with poor sleep, decreased appetite, anhedonia, feeling of guilt and hopelessness worthlessness, poor energy and concentration, crying spells, social isolation, heightened anxiety, and suicidal thinking. He came to the hospital asking for help. He has been compliant with treatment. He is unable to identify any new stressors except that ended chronic wound on his foot that already underwent surgery has recently opened. The patient was briefly hospitalized on medical floor to treat infection. He denies psychotic symptoms or symptoms suggestive of bipolar mania. He reports panic attacks 3 times a week, social  anxiety, and OCD type symptoms that have not been exacerbated lately. He denies alcohol or illicit substance or prescription pill abuse. He has chronic pain that has been treated by [facility] pain clinic.  Past psychiatric history. He was hospitalized 3 times before for manic and depressive episodes. He never attempted suicide. He has been tried on every antidepressant I name except for Remeron. He has been tried on Seroquel and Depakote before.  Family psychiatric history. Mother with bipolar illness.  Social history. He lives with his wife he is disabled from mental illness. He suffers chronic pain.  Total Time spent with patient: 1 hour  Is the patient at risk to self? Yes.    Has the patient been a risk to self in the past 6 months? No.  Has the patient been a risk to self within the distant past? No.  Is the patient a risk to others? No.  Has the patient been a risk to others in the past 6 months? No.  Has the patient been a risk to others within the distant past? No.   Prior Inpatient Therapy:   Prior Outpatient Therapy:    Alcohol Screening: 1. How often do you have a drink containing alcohol?: Never 9. Have you or someone else been injured as a result of your drinking?: No 10. Has a relative or friend or a doctor or another health worker been concerned about your drinking or suggested you cut down?: No Alcohol Use Disorder Identification Test Final Score (AUDIT): 0 Brief Intervention: AUDIT score less than 7 or less-screening does not suggest unhealthy drinking-brief intervention not indicated Substance Abuse History in the last 12 months:  No. Consequences of Substance Abuse: NA Previous Psychotropic Medications: Yes  Psychological Evaluations: No  Past Medical History:  Past Medical History:  Diagnosis Date  .  Chronic kidney disease (CKD), stage III (moderate)    Cr was 2.15 (02/2016)  . Chronic pain   . Diabetes mellitus    insulin-dependent  . Hyperlipemia   . Hypertension    . Schizophrenia [facility])     Past Surgical History:  Procedure Laterality Date  . AMPUTATION TOE Left 2014  . APPENDECTOMY     Family History: History reviewed. No pertinent family history. Tobacco Screening: Have you used any form of tobacco in the last 30 days? (Cigarettes, Smokeless Tobacco, Cigars, and/or Pipes): No Social History:  History  Alcohol Use No    Comment: occ     History  Drug Use No    Additional Social History:                           Allergies:   Allergies  Allergen Reactions  . Erythromycin Base Other (See Comments)    Reaction: heart flutter  . Metformin And Related Hives and Other (See Comments)    Reaction: sweating  . Percocet [Oxycodone-Acetaminophen] Nausea And Vomiting   Lab Results:  Results for orders placed or performed during the hospital encounter of 06/26/16 (from the past 48 hour(s))  Glucose, capillary     Status: Abnormal   Collection Time: 06/26/16  4:47 PM  Result Value Ref Range   Glucose-Capillary 352 (H) 65 - 99 mg/dL  Glucose, capillary     Status: Abnormal   Collection Time: 06/26/16  9:02 PM  Result Value Ref Range   Glucose-Capillary 282 (H) 65 - 99 mg/dL  Glucose, capillary     Status: Abnormal   Collection Time: 06/27/16  6:58 AM  Result Value Ref Range   Glucose-Capillary 236 (H) 65 - 99 mg/dL  Glucose, capillary     Status: Abnormal   Collection Time: 06/27/16 11:38 AM  Result Value Ref Range   Glucose-Capillary 211 (H) 65 - 99 mg/dL   Comment 1 Notify RN     Blood Alcohol level:  Lab Results  Component Value Date   ETH <5 06/24/2016   ETH  01/27/2011    <5        LOWEST DETECTABLE LIMIT FOR SERUM ALCOHOL IS 5 mg/dL FOR MEDICAL PURPOSES ONLY    Metabolic Disorder Labs:  Lab Results  Component Value Date   HGBA1C 13.9 (H) 06/24/2016   MPG 324 (H) 01/28/2011   MPG 157 11/01/2008   No results found for: PROLACTIN Lab Results  Component Value Date   CHOL  11/16/2008    170        ATP  III CLASSIFICATION:  <200     mg/dL   Desirable  200-239  mg/dL   Borderline High  >=240    mg/dL   High          TRIG 189 (H) 11/16/2008   HDL 37 (L) 11/16/2008   CHOLHDL 4.6 11/16/2008   VLDL 38 11/16/2008   LDLCALC  11/16/2008    95        Total Cholesterol/HDL:CHD Risk Coronary Heart Disease Risk Table                     Men   Women  1/2 Average Risk   3.4   3.3  Average Risk       5.0   4.4  2 X Average Risk   9.6   7.1  3 X Average Risk  23.4   11.0  Use the calculated Patient Ratio above and the CHD Risk Table to determine the patient's CHD Risk.        ATP III CLASSIFICATION (LDL):  <100     mg/dL   Optimal  100-129  mg/dL   Near or Above                    Optimal  130-159  mg/dL   Borderline  160-189  mg/dL   High  >190     mg/dL   Very High   LDLCALC  11/15/2008    90        Total Cholesterol/HDL:CHD Risk Coronary Heart Disease Risk Table                     Men   Women  1/2 Average Risk   3.4   3.3  Average Risk       5.0   4.4  2 X Average Risk   9.6   7.1  3 X Average Risk  23.4   11.0        Use the calculated Patient Ratio above and the CHD Risk Table to determine the patient's CHD Risk.        ATP III CLASSIFICATION (LDL):  <100     mg/dL   Optimal  100-129  mg/dL   Near or Above                    Optimal  130-159  mg/dL   Borderline  160-189  mg/dL   High  >190     mg/dL   Very High    Current Medications: Current Facility-Administered Medications  Medication Dose Route Frequency Provider Last Rate Last Dose  . acetaminophen (TYLENOL) tablet 650 mg  650 mg Oral Q6H PRN Eshani Maestre B Shauntell Iglesia, MD      . alum & mag hydroxide-simeth (MAALOX/MYLANTA) 200-200-20 MG/5ML suspension 30 mL  30 mL Oral Q4H PRN Tateanna Bach B Loras Grieshop, MD      . atorvastatin (LIPITOR) tablet 40 mg  40 mg Oral q1800 Remberto Lienhard B Capria Cartaya, MD   40 mg at 06/26/16 1647  . baclofen (LIORESAL) tablet 10 mg  10 mg Oral TID Clovis Fredrickson, MD   10 mg at 06/27/16 1124   . buPROPion (WELLBUTRIN SR) 12 hr tablet 150 mg  150 mg Oral BID Clovis Fredrickson, MD   150 mg at 06/27/16 0824  . carvedilol (COREG) tablet 25 mg  25 mg Oral BID WC Dejana Pugsley B Slaton Reaser, MD   25 mg at 06/27/16 0654  . [START ON 07/03/2016] cloNIDine (CATAPRES - Dosed in mg/24 hr) patch 0.2 mg  0.2 mg Transdermal Weekly Keiyon Plack B Samary Shatz, MD      . collagenase (SANTYL) ointment   Topical Daily Tadhg Eskew B Velta Rockholt, MD      . doxycycline (VIBRA-TABS) tablet 100 mg  100 mg Oral Q12H Tali Cleaves B Adamarie Izzo, MD   100 mg at 06/27/16 0823  . DULoxetine (CYMBALTA) DR capsule 120 mg  120 mg Oral Daily Bristyl Mclees B Zamarah Ullmer, MD   120 mg at 06/27/16 0824  . famotidine (PEPCID) tablet 20 mg  20 mg Oral QHS Clovis Fredrickson, MD   20 mg at 06/26/16 2229  . HYDROcodone-acetaminophen (NORCO) 10-325 MG per tablet 2 tablet  2 tablet Oral Q6H PRN Makhayla Mcmurry B Sherman Donaldson, MD      . insulin aspart (novoLOG) injection 0-20 Units  0-20 Units Subcutaneous TID WC Evamaria Detore B  Emiliano Welshans, MD   7 Units at 06/27/16 1138  . insulin aspart (novoLOG) injection 0-5 Units  0-5 Units Subcutaneous QHS Clovis Fredrickson, MD   3 Units at 06/26/16 2237  . insulin aspart (novoLOG) injection 5 Units  5 Units Subcutaneous TID WC Clovis Fredrickson, MD   5 Units at 06/27/16 1138  . insulin glargine (LANTUS) injection 30 Units  30 Units Subcutaneous BID Clovis Fredrickson, MD   30 Units at 06/27/16 0728  . lamoTRIgine (LAMICTAL) tablet 25 mg  25 mg Oral QHS Vaniah Chambers B Alyra Patty, MD      . magnesium hydroxide (MILK OF MAGNESIA) suspension 30 mL  30 mL Oral Daily PRN Merril Nagy B Jozalynn Noyce, MD      . OLANZapine (ZYPREXA) tablet 20 mg  20 mg Oral QHS Marcques Wrightsman B Okla Qazi, MD   20 mg at 06/26/16 2229  . rivaroxaban (XARELTO) tablet 20 mg  20 mg Oral Q supper Clovis Fredrickson, MD   20 mg at 06/26/16 2229  . traZODone (DESYREL) tablet 100 mg  100 mg Oral QHS Arnol Mcgibbon B Yamir Carignan, MD       PTA Medications: Prescriptions Prior to  Admission  Medication Sig Dispense Refill Last Dose  . amLODipine (NORVASC) 5 MG tablet Take 1 tablet (5 mg total) by mouth daily. 30 tablet 2   . atorvastatin (LIPITOR) 40 MG tablet Take 40 mg by mouth daily.   06/24/2016  . baclofen (LIORESAL) 10 MG tablet Take 10 mg by mouth 3 (three) times daily.   06/24/2016  . buPROPion (ZYBAN) 150 MG 12 hr tablet Take 150 mg by mouth 2 (two) times daily.   06/24/2016  . carvedilol (COREG) 25 MG tablet Take 25 mg by mouth 2 (two) times daily with a meal.    at Unknown time  . cloNIDine (CATAPRES - DOSED IN MG/24 HR) 0.2 mg/24hr patch Place 1 patch (0.2 mg total) onto the skin once a week. 4 patch 12   . collagenase (SANTYL) ointment Apply topically daily. Topical, Daily, First dose on Mon 06/25/16 at 1230, For 14 doses :Cleanse wound to right plantar foot with NS and pat gently dry.Apply Santyl to wound bed.Cover with NS moist gauze.Secure with 4x4 gauze and kerlix/tape.Change daily. 15 g 0   . doxycycline (VIBRA-TABS) 100 MG tablet Take 1 tablet (100 mg total) by mouth every 12 (twelve) hours. X 7 days 14 tablet 0   . DULoxetine (CYMBALTA) 60 MG capsule Take 120 mg by mouth daily.   06/24/2016 at Unknown time  . famotidine (PEPCID) 20 MG tablet Take 20 mg by mouth at bedtime.   06/24/2016 at Unknown time  . HYDROcodone-acetaminophen (NORCO) 10-325 MG tablet Take 1 tablet by mouth every 6 (six) hours as needed. 30 tablet 0   . insulin aspart (NOVOLOG) 100 UNIT/ML injection Inject 5 Units into the skin 3 (three) times daily with meals. 10 mL 11   . insulin glargine (LANTUS) 100 unit/mL SOPN Inject 0.3 mLs (30 Units total) into the skin 2 (two) times daily. 15 mL 11   . OLANZapine zydis (ZYPREXA) 20 MG disintegrating tablet Take 20 mg by mouth at bedtime.   06/24/2016 at Unknown time  . OLANZapine zydis (ZYPREXA) 5 MG disintegrating tablet Take 5 mg by mouth at bedtime.   06/24/2016 at Unknown time  . rivaroxaban (XARELTO) 20 MG TABS tablet Take 20 mg by mouth  daily with supper.   06/24/2016 at Unknown time    Musculoskeletal: Strength & Muscle Tone: within  normal limits Gait & Station: unsteady Patient leans: N/A  Psychiatric Specialty Exam: I reviewed physical examination performed on the medical floor and agree with their findings. Physical Exam  Nursing note and vitals reviewed.   Review of Systems  Musculoskeletal: Positive for back pain and joint pain.  Psychiatric/Behavioral: Positive for depression and suicidal ideas.  All other systems reviewed and are negative.   Blood pressure (!) 185/112, pulse 77, temperature 98.4 F (36.9 C), temperature source Oral, resp. rate 18, height 6\' 3"  (1.905 m), weight 128.8 kg (284 lb), SpO2 99 %.Body mass index is 35.5 kg/m.   See SRA.                                                  Sleep:  Number of Hours: 7.75       Treatment Plan Summary: Daily contact with patient to assess and evaluate symptoms and progress in treatment and Medication management   Mr. Reeck is a 50 year old male with a history of schizoaffective disorder and multiple medical problems transfer from medical floor where he was hospitalized for acute on chronic kidney disease to treat depression and suicidal ideation.  1. Suicidal ideation. The patient is able to contract for safety in the hospital.  2. Mood. We will continue Zyprexa, Cymbalta and Wellbutrin for depression and mood stabilization. We we will add Lamictal.  3. Chronic pain. He is on Oxycodone and Baclofen.  4. Diabetes. He is on Lantus, Novolog, ADA diet and SSI. Input from Diabetes Nurse Coordinators is greatly appreciated.  5. Dyslipidemia. He is on Lipitor.  6. GERD. He is on Pepcid.  7. HTN. He is on Coreg and Clonidine.  8. Osteomielitis/foot wound. We will continue Vibramycin and wound care.  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  10. Metabolic syndrome monitoring. Lipid profile and hemoglobin A1c are pending.   11.  Disposition. He will be discharged to home with his wife. He will ollow up with [facility].  Observation Level/Precautions:  15 minute checks  Laboratory:  CBC Chemistry Profile UDS UA  Psychotherapy:    Medications:    Consultations:    Discharge Concerns:    Estimated LOS:  Other:     I certify that inpatient services furnished can reasonably be expected to improve the patient's condition.    Orson Slick, MD 8/30/20171:33 PM

## 2016-06-27 NOTE — Progress Notes (Signed)
Patient with depressed affect, cooperative behavior with meals, meds and plan of care. No SI/HI at this time. Patient remains with low energy and low interest. Eats am meal, takes meds, returns to bed to sleep. Patient states he showers and allows for dressing to change to diabetic wound on ball of right foot. Denies discomfort. No s/s of infection, area clean and dry. Patient refusing therapy groups stating he does no want to be around a lot of people. States his wife may come to visit  Minimal interaction with peers. Safety maintained.

## 2016-06-27 NOTE — [facility] Group Notes (Signed)
[facility] Group Notes:  (Nursing/MHT/Case Management/Adjunct)  Date:  06/27/2016  Time:  10:28 PM  Type of Therapy:  Evening Wrap-up Group  Participation Level:  Did Not Attend  Participation Quality:  N/A  Affect:  N/A  Cognitive:  N/A  Insight:  None  Engagement in Group:  Did Not Attend  Modes of Intervention:  Activity and Discussion  Summary of Progress/Problems:  [name] 06/27/2016, 10:28 PM

## 2016-06-28 DIAGNOSIS — I10 Essential (primary) hypertension: Secondary | ICD-10-CM | POA: Diagnosis not present

## 2016-06-28 LAB — LIPID PANEL
CHOL/HDL RATIO: 9.1 ratio
CHOLESTEROL: 309 mg/dL — AB (ref 0–200)
HDL: 34 mg/dL — AB (ref >40)
LDL Cholesterol: UNDETERMINED mg/dL (ref 0–99)
Triglycerides: 707 mg/dL — ABNORMAL HIGH (ref <150)
VLDL: UNDETERMINED mg/dL (ref 0–40)

## 2016-06-28 LAB — GLUCOSE, CAPILLARY
GLUCOSE-CAPILLARY: 155 mg/dL — AB (ref 65–99)
GLUCOSE-CAPILLARY: 240 mg/dL — AB (ref 65–99)
GLUCOSE-CAPILLARY: 232 mg/dL — AB (ref 65–99)
Glucose-Capillary: 190 mg/dL — ABNORMAL HIGH (ref 65–99)

## 2016-06-28 LAB — HEMOGLOBIN A1C: Hgb A1c MFr Bld: 13.9 % — ABNORMAL HIGH (ref 4.0–6.0)

## 2016-06-28 MED ORDER — NEOMYCIN-POLYMYXIN-PRAMOXINE 1 % EX CREA
TOPICAL_CREAM | Freq: Two times a day (BID) | CUTANEOUS | Status: DC
  Filled 2016-06-28 (×2): qty 28

## 2016-06-28 MED ORDER — BACITRACIN-NEOMYCIN-POLYMYXIN OINTMENT TUBE
TOPICAL_OINTMENT | Freq: Two times a day (BID) | CUTANEOUS | Status: DC
  Administered 2016-06-28 – 2016-06-29 (×2): via TOPICAL
  Filled 2016-06-28: qty 15

## 2016-06-28 NOTE — Tx Team (Signed)
Interdisciplinary Treatment and Diagnostic Plan Update  06/28/2016 Time of Session: 10:34 AM  ADD JEZEWSKI MRN: [id number]  Principal Diagnosis: Schizoaffective disorder, bipolar type ([place])  Secondary Diagnoses: Principal Problem:   Schizoaffective disorder, bipolar type ([place]) Active Problems:   Suicidal ideation   DM (diabetes mellitus) type 2, uncontrolled, with ketoacidosis ([place])   Dyslipidemia   Essential hypertension   Osteomyelitis (HCC)   GERD (gastroesophageal reflux disease)   Chronic pain syndrome   Current Medications:  Current Facility-Administered Medications  Medication Dose Route Frequency Provider Last Rate Last Dose  . acetaminophen (TYLENOL) tablet 650 mg  650 mg Oral Q6H PRN Jolanta B Pucilowska, MD      . alum & mag hydroxide-simeth (MAALOX/MYLANTA) 200-200-20 MG/5ML suspension 30 mL  30 mL Oral Q4H PRN Jolanta B Pucilowska, MD      . atorvastatin (LIPITOR) tablet 40 mg  40 mg Oral q1800 Jolanta B Pucilowska, MD   40 mg at 06/27/16 1641  . baclofen (LIORESAL) tablet 10 mg  10 mg Oral TID Clovis Fredrickson, MD   10 mg at 06/28/16 0818  . buPROPion (WELLBUTRIN SR) 12 hr tablet 150 mg  150 mg Oral BID Clovis Fredrickson, MD   150 mg at 06/28/16 0817  . carvedilol (COREG) tablet 25 mg  25 mg Oral BID WC Jolanta B Pucilowska, MD   25 mg at 06/28/16 0817  . [START ON 07/03/2016] cloNIDine (CATAPRES - Dosed in mg/24 hr) patch 0.2 mg  0.2 mg Transdermal Weekly Jolanta B Pucilowska, MD      . doxycycline (VIBRA-TABS) tablet 100 mg  100 mg Oral Q12H Jolanta B Pucilowska, MD   100 mg at 06/28/16 0817  . DULoxetine (CYMBALTA) DR capsule 120 mg  120 mg Oral Daily Jolanta B Pucilowska, MD   120 mg at 06/28/16 0817  . famotidine (PEPCID) tablet 20 mg  20 mg Oral QHS Jolanta B Pucilowska, MD   20 mg at 06/27/16 2142  . HYDROcodone-acetaminophen (NORCO) 10-325 MG per tablet 2 tablet  2 tablet Oral Q6H PRN Clovis Fredrickson, MD   2 tablet at 06/28/16 0821  . insulin aspart  (novoLOG) injection 0-20 Units  0-20 Units Subcutaneous TID WC Clovis Fredrickson, MD   5 Units at 06/27/16 1749  . insulin aspart (novoLOG) injection 0-5 Units  0-5 Units Subcutaneous QHS Clovis Fredrickson, MD   2 Units at 06/27/16 2148  . insulin aspart (novoLOG) injection 5 Units  5 Units Subcutaneous TID WC Clovis Fredrickson, MD   5 Units at 06/28/16 0818  . insulin glargine (LANTUS) injection 30 Units  30 Units Subcutaneous BID Clovis Fredrickson, MD   30 Units at 06/28/16 0818  . lamoTRIgine (LAMICTAL) tablet 25 mg  25 mg Oral QHS Clovis Fredrickson, MD   25 mg at 06/27/16 2145  . magnesium hydroxide (MILK OF MAGNESIA) suspension 30 mL  30 mL Oral Daily PRN Jolanta B Pucilowska, MD      . neomycin-polymyxin-pramoxine (NEOSPORIN PLUS) cream   Topical BID Jolanta B Pucilowska, MD      . OLANZapine (ZYPREXA) tablet 20 mg  20 mg Oral QHS Jolanta B Pucilowska, MD   20 mg at 06/27/16 2142  . rivaroxaban (XARELTO) tablet 20 mg  20 mg Oral Q supper Clovis Fredrickson, MD   20 mg at 06/27/16 1641  . traZODone (DESYREL) tablet 100 mg  100 mg Oral QHS Clovis Fredrickson, MD   100 mg at 06/27/16 2141  PTA Medications: Prescriptions Prior to Admission  Medication Sig Dispense Refill Last Dose  . amLODipine (NORVASC) 5 MG tablet Take 1 tablet (5 mg total) by mouth daily. 30 tablet 2   . atorvastatin (LIPITOR) 40 MG tablet Take 40 mg by mouth daily.   06/24/2016  . baclofen (LIORESAL) 10 MG tablet Take 10 mg by mouth 3 (three) times daily.   06/24/2016  . buPROPion (ZYBAN) 150 MG 12 hr tablet Take 150 mg by mouth 2 (two) times daily.   06/24/2016  . carvedilol (COREG) 25 MG tablet Take 25 mg by mouth 2 (two) times daily with a meal.    at Unknown time  . cloNIDine (CATAPRES - DOSED IN MG/24 HR) 0.2 mg/24hr patch Place 1 patch (0.2 mg total) onto the skin once a week. 4 patch 12   . collagenase (SANTYL) ointment Apply topically daily. Topical, Daily, First dose on Mon 06/25/16 at 1230, For  14 doses :Cleanse wound to right plantar foot with NS and pat gently dry.Apply Santyl to wound bed.Cover with NS moist gauze.Secure with 4x4 gauze and kerlix/tape.Change daily. 15 g 0   . doxycycline (VIBRA-TABS) 100 MG tablet Take 1 tablet (100 mg total) by mouth every 12 (twelve) hours. X 7 days 14 tablet 0   . DULoxetine (CYMBALTA) 60 MG capsule Take 120 mg by mouth daily.   06/24/2016 at Unknown time  . famotidine (PEPCID) 20 MG tablet Take 20 mg by mouth at bedtime.   06/24/2016 at Unknown time  . HYDROcodone-acetaminophen (NORCO) 10-325 MG tablet Take 1 tablet by mouth every 6 (six) hours as needed. 30 tablet 0   . insulin aspart (NOVOLOG) 100 UNIT/ML injection Inject 5 Units into the skin 3 (three) times daily with meals. 10 mL 11   . insulin glargine (LANTUS) 100 unit/mL SOPN Inject 0.3 mLs (30 Units total) into the skin 2 (two) times daily. 15 mL 11   . OLANZapine zydis (ZYPREXA) 20 MG disintegrating tablet Take 20 mg by mouth at bedtime.   06/24/2016 at Unknown time  . OLANZapine zydis (ZYPREXA) 5 MG disintegrating tablet Take 5 mg by mouth at bedtime.   06/24/2016 at Unknown time  . rivaroxaban (XARELTO) 20 MG TABS tablet Take 20 mg by mouth daily with supper.   06/24/2016 at Unknown time    Treatment Modalities: Medication Management, Group therapy, Case management,  1 to 1 session with clinician, Psychoeducation, Recreational therapy.   Physician Treatment Plan for Primary Diagnosis: Schizoaffective disorder, bipolar type ([place]) Long Term Goal(s): Improvement in symptoms so as ready for discharge  Short Term Goals: Ability to identify changes in lifestyle to reduce recurrence of condition will improve, Ability to identify and develop effective coping behaviors will improve and Ability to maintain clinical measurements within normal limits will improve  Medication Management: Evaluate patient's response, side effects, and tolerance of medication regimen.  Therapeutic  Interventions: 1 to 1 sessions, Unit Group sessions and Medication administration.  Evaluation of Outcomes: Progressing  Physician Treatment Plan for Secondary Diagnosis: Principal Problem:   Schizoaffective disorder, bipolar type ([place]) Active Problems:   Suicidal ideation   DM (diabetes mellitus) type 2, uncontrolled, with ketoacidosis ([place])   Dyslipidemia   Essential hypertension   Osteomyelitis (HCC)   GERD (gastroesophageal reflux disease)   Chronic pain syndrome   Long Term Goal(s): Improvement in symptoms so as ready for discharge  Short Term Goals: Ability to identify changes in lifestyle to reduce recurrence of condition will improve, Ability to disclose and discuss  suicidal ideas, Ability to identify and develop effective coping behaviors will improve, Ability to maintain clinical measurements within normal limits will improve and Compliance with prescribed medications will improve  Medication Management: Evaluate patient's response, side effects, and tolerance of medication regimen.  Therapeutic Interventions: 1 to 1 sessions, Unit Group sessions and Medication administration.  Evaluation of Outcomes: Progressing   RN Treatment Plan for Primary Diagnosis: Schizoaffective disorder, bipolar type ([place]) Long Term Goal(s): Knowledge of disease and therapeutic regimen to maintain health will improve  Short Term Goals: Ability to participate in decision making will improve, Ability to verbalize feelings will improve, Ability to disclose and discuss suicidal ideas, Ability to identify and develop effective coping behaviors will improve and Compliance with prescribed medications will improve  Medication Management: RN will administer medications as ordered by provider, will assess and evaluate patient's response and provide education to patient for prescribed medication. RN will report any adverse and/or side effects to prescribing provider.  Therapeutic Interventions: 1 on 1  counseling sessions, Psychoeducation, Medication administration, Evaluate responses to treatment, Monitor vital signs and CBGs as ordered, Perform/monitor CIWA, COWS, AIMS and Fall Risk screenings as ordered, Perform wound care treatments as ordered.  Evaluation of Outcomes: Progressing   LCSW Treatment Plan for Primary Diagnosis: Schizoaffective disorder, bipolar type ([place]) Long Term Goal(s): Safe transition to appropriate next level of care at discharge, Engage patient in therapeutic group addressing interpersonal concerns.  Short Term Goals: Engage patient in aftercare planning with referrals and resources, Increase social support, Increase ability to appropriately verbalize feelings, Increase emotional regulation, Facilitate acceptance of mental health diagnosis and concerns and Increase skills for wellness and recovery  Therapeutic Interventions: Assess for all discharge needs, 1 to 1 time with Social worker, Explore available resources and support systems, Assess for adequacy in community support network, Educate family and significant other(s) on suicide prevention, Complete Psychosocial Assessment, Interpersonal group therapy.  Evaluation of Outcomes: Progressing   Progress in Treatment: Attending groups: No Participating in groups: No Taking medication as prescribed: Yes, MD continues to assess for medication changes as needed Toleration medication: Yes, no side effects reported at this time Family/Significant other contact made: CSW, still assessing for appropriate contacts Patient understands diagnosis: Yes Discussing patient identified problems/goals with staff: Yes Medical problems stabilized or resolved:  Yes Denies suicidal/homicidal ideation: Yes Issues/concerns per patient self-inventory: None Other: N/A  New problem(s) identified: None identified at this time.   New Short Term/Long Term Goal(s): None identified at this time.   Discharge Plan or Barriers: Pt will  discharge home to [place], [place] and will follow up with [facility] for medication management and therapy   Reason for Continuation of Hospitalization: Anxiety Depression Medical Issues Medication stabilization Suicidal ideation   Estimated Length of Stay: 3-5 days  Attendees: Patient: Antwoin Sawaya 06/28/2016  9:40 AM  Physician: Dr. Bary Leriche, MD 06/28/2016  9:40 AM  Nursing: Elige Radon, RN 06/28/2016  9:40 AM  RN Care Manager:  06/28/2016  9:40 AM  Social Worker: Alphonse Guild. Daine Gravel, LCAS   06/28/2016  9:40 AM  Recreational Therapist:                       06/28/2016  9:40 AM  Social Worker:  06/28/2016  9:40 AM  Other:  06/28/2016  9:40 AM  Other: 06/28/2016  9:40 AM    Scribe for Treatment Team:  Alphonse Guild. Shantella Blubaugh MSW, LCSWA, LCAS
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
## 2016-06-28 NOTE — Progress Notes (Signed)
Inpatient Diabetes Program Recommendations  AACE/ADA: New Consensus Statement on Inpatient Glycemic Control (2015)  Target Ranges:  Prepandial:   less than 140 mg/dL      Peak postprandial:   less than 180 mg/dL (1-2 hours)      Critically ill patients:  140 - 180 mg/dL   Lab Results  Component Value Date   GLUCAP 155 (H) 06/28/2016   HGBA1C 13.9 (H) 06/24/2016    Review of Glycemic Control   Results for [name], [name] (MRN [id number]) as of 06/28/2016 11:16  Ref. Range 06/27/2016 06:58 06/27/2016 11:38 06/27/2016 16:40 06/27/2016 20:20 06/28/2016 06:37  Glucose-Capillary Latest Ref Range: 65 - 99 mg/dL 236 (H) 211 (H) 158 (H) 218 (H) 155 (H)    Diabetes history: Type 2 Outpatient Diabetes medications: Levemir 45 units bid, Novolog 20-30 units tid with meals  Current orders for Inpatient glycemic control:  Lantus 30units qday, Novolog 5 units tid with meals, Novolog 0-20 units tid, Novolog 0-5 units qhs  Inpatient Diabetes Program Recommendations:   Agree with current orders for blood sugar management  Gentry Fitz, RN, [place], [place], CDE Diabetes Coordinator Inpatient Diabetes Program  [phone] (Team Pager) [phone] ([place]) 06/28/2016 11:22 AM

## 2016-06-28 NOTE — Progress Notes (Signed)
Patient denies SI/AVH/HI.  Patient refused to attend groups.  Patient has wound located on R foot that has a dressing change to be completed twice a day and neosporin is now being used for the wound.  Patient was encouraged to attend groups but patient still refused stating, " I would rather be in my room."  Patient received scheduled medications and was med compliant.

## 2016-06-28 NOTE — BHH Group Notes (Signed)
[place] LCSW Group Therapy  06/28/2016 12:05 PM  Type of Therapy:  Group Therapy  Participation Level:  Pt did not attend group. CSW invited pt to group.   Summary of Progress/Problems: Balance in life: Patients will discuss the concept of balance and how it looks and feels to be unbalanced. Pt will identify areas in their life that is unbalanced and ways to become more balanced.    Milee Qualls G. [place], [place] 06/28/2016, 12:05 PM

## 2016-06-28 NOTE — BHH Group Notes (Signed)
[facility] Group Notes:  (Nursing/MHT/Case Management/Adjunct)  Date:  06/28/2016  Time:  4:37 PM  Type of Therapy:  Psychoeducational Skills  Participation Level:  Did Not Attend    Primitivo Gauze 06/28/2016, 4:37 PM

## 2016-06-28 NOTE — Progress Notes (Signed)
[facility] MD Progress Note  06/28/2016 1:11 PM [name]  MRN:  [id number]  Subjective:  [name] met with treatment team today. He reports feeling better. Suicidal ideation is less intrusive. His wife visited last night and the visit went well. He tolerates medications without problems. His pain doesn't no longer bothered him once we adjusted the dose of his narcotics. He follows up with [facility] pain clinic. There is good group participation. Sleep and appetite are fine. His blood pressure that was elevated yesterday is returning to normal.  Principal Problem: Schizoaffective disorder, bipolar type [facility]) Diagnosis:   Patient Active Problem List   Diagnosis Date Noted  . DM (diabetes mellitus) type 2, uncontrolled, with ketoacidosis ([place]) [E13.10] 06/26/2016  . Dyslipidemia [E78.5] 06/26/2016  . Essential hypertension [I10] 06/26/2016  . Osteomyelitis ([place]) [M86.9] 06/26/2016  . GERD (gastroesophageal reflux disease) [K21.9] 06/26/2016  . Chronic pain syndrome [G89.4] 06/26/2016  . Acute on chronic kidney failure ([place]) [N17.9, N18.9] 06/25/2016  . Schizoaffective disorder, bipolar type ([place]) [F25.0] 06/25/2016  . Suicidal ideation [R45.851] 06/25/2016   Total Time spent with patient: 20 minutes  Past Psychiatric History: Schizzoaffective disorder.  Past Medical History:  Past Medical History:  Diagnosis Date  . Chronic kidney disease (CKD), stage III (moderate)    Cr was 2.15 (02/2016)  . Chronic pain   . Diabetes mellitus    insulin-dependent  . Hyperlipemia   . Hypertension   . Schizophrenia [facility])     Past Surgical History:  Procedure Laterality Date  . AMPUTATION TOE Left 2014  . APPENDECTOMY     Family History: History reviewed. No pertinent family history. Family Psychiatric  History: See H&P. Social History:  History  Alcohol Use No    Comment: occ     History  Drug Use No    Social History   Social History  . Marital status: Married    Spouse name: N/A  .  Number of children: N/A  . Years of education: N/A   Social History Main Topics  . Smoking status: Never Smoker  . Smokeless tobacco: Never Used  . Alcohol use No     Comment: occ  . Drug use: No  . Sexual activity: Yes    Birth control/ protection: None   Other Topics Concern  . None   Social History Narrative  . None   Additional Social History:                         Sleep: Fair  Appetite:  Fair  Current Medications: Current Facility-Administered Medications  Medication Dose Route Frequency Provider Last Rate Last Dose  . acetaminophen (TYLENOL) tablet 650 mg  650 mg Oral Q6H PRN Taytem Ghattas B Laydon Martis, MD      . alum & mag hydroxide-simeth (MAALOX/MYLANTA) 200-200-20 MG/5ML suspension 30 mL  30 mL Oral Q4H PRN Alexea Blase B Kylor Valverde, MD      . atorvastatin (LIPITOR) tablet 40 mg  40 mg Oral q1800 Ebonique Hallstrom B Pasha Gadison, MD   40 mg at 06/27/16 1641  . baclofen (LIORESAL) tablet 10 mg  10 mg Oral TID Clovis Fredrickson, MD   10 mg at 06/28/16 1152  . buPROPion (WELLBUTRIN SR) 12 hr tablet 150 mg  150 mg Oral BID Clovis Fredrickson, MD   150 mg at 06/28/16 0817  . carvedilol (COREG) tablet 25 mg  25 mg Oral BID WC Quillan Whitter B Eriyah Fernando, MD   25 mg at 06/28/16 0817  . [  START ON 07/03/2016] cloNIDine (CATAPRES - Dosed in mg/24 hr) patch 0.2 mg  0.2 mg Transdermal Weekly Darius Fillingim B Adela Esteban, MD      . doxycycline (VIBRA-TABS) tablet 100 mg  100 mg Oral Q12H Kenzleigh Sedam B Derin Granquist, MD   100 mg at 06/28/16 0817  . DULoxetine (CYMBALTA) DR capsule 120 mg  120 mg Oral Daily Karena Kinker B Tandra Rosado, MD   120 mg at 06/28/16 0817  . famotidine (PEPCID) tablet 20 mg  20 mg Oral QHS Joesiah Lonon B Nattalie Santiesteban, MD   20 mg at 06/27/16 2142  . HYDROcodone-acetaminophen (NORCO) 10-325 MG per tablet 2 tablet  2 tablet Oral Q6H PRN Clovis Fredrickson, MD   2 tablet at 06/28/16 0821  . insulin aspart (novoLOG) injection 0-20 Units  0-20 Units Subcutaneous TID WC Clovis Fredrickson, MD   7 Units  at 06/28/16 1152  . insulin aspart (novoLOG) injection 0-5 Units  0-5 Units Subcutaneous QHS Clovis Fredrickson, MD   2 Units at 06/27/16 2148  . insulin aspart (novoLOG) injection 5 Units  5 Units Subcutaneous TID WC Clovis Fredrickson, MD   5 Units at 06/28/16 1152  . insulin glargine (LANTUS) injection 30 Units  30 Units Subcutaneous BID Clovis Fredrickson, MD   30 Units at 06/28/16 0818  . lamoTRIgine (LAMICTAL) tablet 25 mg  25 mg Oral QHS Clovis Fredrickson, MD   25 mg at 06/27/16 2145  . magnesium hydroxide (MILK OF MAGNESIA) suspension 30 mL  30 mL Oral Daily PRN Jessyca Sloan B Elisheva Fallas, MD      . neomycin-bacitracin-polymyxin (NEOSPORIN) ointment   Topical BID Denice Bors, RN      . OLANZapine (ZYPREXA) tablet 20 mg  20 mg Oral QHS Clovis Fredrickson, MD   20 mg at 06/27/16 2142  . rivaroxaban (XARELTO) tablet 20 mg  20 mg Oral Q supper Clovis Fredrickson, MD   20 mg at 06/27/16 1641  . traZODone (DESYREL) tablet 100 mg  100 mg Oral QHS Clovis Fredrickson, MD   100 mg at 06/27/16 2141    Lab Results:  Results for orders placed or performed during the hospital encounter of 06/26/16 (from the past 48 hour(s))  Glucose, capillary     Status: Abnormal   Collection Time: 06/26/16  4:47 PM  Result Value Ref Range   Glucose-Capillary 352 (H) 65 - 99 mg/dL  Glucose, capillary     Status: Abnormal   Collection Time: 06/26/16  9:02 PM  Result Value Ref Range   Glucose-Capillary 282 (H) 65 - 99 mg/dL  Glucose, capillary     Status: Abnormal   Collection Time: 06/27/16  6:58 AM  Result Value Ref Range   Glucose-Capillary 236 (H) 65 - 99 mg/dL  Glucose, capillary     Status: Abnormal   Collection Time: 06/27/16 11:38 AM  Result Value Ref Range   Glucose-Capillary 211 (H) 65 - 99 mg/dL   Comment 1 Notify RN   Glucose, capillary     Status: Abnormal   Collection Time: 06/27/16  4:40 PM  Result Value Ref Range   Glucose-Capillary 158 (H) 65 - 99 mg/dL  Glucose, capillary      Status: Abnormal   Collection Time: 06/27/16  8:20 PM  Result Value Ref Range   Glucose-Capillary 218 (H) 65 - 99 mg/dL  Glucose, capillary     Status: Abnormal   Collection Time: 06/28/16  6:37 AM  Result Value Ref Range   Glucose-Capillary 155 (H) 65 -  99 mg/dL  Lipid panel     Status: Abnormal   Collection Time: 06/28/16  7:19 AM  Result Value Ref Range   Cholesterol 309 (H) 0 - 200 mg/dL   Triglycerides 707 (H) <150 mg/dL   HDL 34 (L) >40 mg/dL   Total CHOL/HDL Ratio 9.1 RATIO   VLDL UNABLE TO CALCULATE IF TRIGLYCERIDE OVER 400 mg/dL 0 - 40 mg/dL   LDL Cholesterol UNABLE TO CALCULATE IF TRIGLYCERIDE OVER 400 mg/dL 0 - 99 mg/dL    Comment:        Total Cholesterol/HDL:CHD Risk Coronary Heart Disease Risk Table                     Men   Women  1/2 Average Risk   3.4   3.3  Average Risk       5.0   4.4  2 X Average Risk   9.6   7.1  3 X Average Risk  23.4   11.0        Use the calculated Patient Ratio above and the CHD Risk Table to determine the patient's CHD Risk.        ATP III CLASSIFICATION (LDL):  <100     mg/dL   Optimal  100-129  mg/dL   Near or Above                    Optimal  130-159  mg/dL   Borderline  160-189  mg/dL   High  >190     mg/dL   Very High   Glucose, capillary     Status: Abnormal   Collection Time: 06/28/16 11:47 AM  Result Value Ref Range   Glucose-Capillary 240 (H) 65 - 99 mg/dL   Comment 1 Notify RN    Comment 2 Document in Chart     Blood Alcohol level:  Lab Results  Component Value Date   ETH <5 06/24/2016   [facility]  01/27/2011    <5        LOWEST DETECTABLE LIMIT FOR SERUM ALCOHOL IS 5 mg/dL FOR MEDICAL PURPOSES ONLY    Metabolic Disorder Labs: Lab Results  Component Value Date   HGBA1C 13.9 (H) 06/24/2016   MPG 324 (H) 01/28/2011   MPG 157 11/01/2008   No results found for: PROLACTIN Lab Results  Component Value Date   CHOL 309 (H) 06/28/2016   TRIG 707 (H) 06/28/2016   HDL 34 (L) 06/28/2016   CHOLHDL 9.1 06/28/2016    VLDL UNABLE TO CALCULATE IF TRIGLYCERIDE OVER 400 mg/dL 06/28/2016   LDLCALC UNABLE TO CALCULATE IF TRIGLYCERIDE OVER 400 mg/dL 06/28/2016   LDLCALC  11/16/2008    95        Total Cholesterol/HDL:CHD Risk Coronary Heart Disease Risk Table                     Men   Women  1/2 Average Risk   3.4   3.3  Average Risk       5.0   4.4  2 X Average Risk   9.6   7.1  3 X Average Risk  23.4   11.0        Use the calculated Patient Ratio above and the CHD Risk Table to determine the patient's CHD Risk.        ATP III CLASSIFICATION (LDL):  <100     mg/dL   Optimal  100-129  mg/dL   Near or Above  Optimal  130-159  mg/dL   Borderline  160-189  mg/dL   High  >190     mg/dL   Very High    Physical Findings: AIMS:  , ,  ,  ,    CIWA:    COWS:     Musculoskeletal: Strength & Muscle Tone: within normal limits Gait & Station: normal Patient leans: N/A  Psychiatric Specialty Exam: Physical Exam  Nursing note and vitals reviewed.   Review of Systems  Musculoskeletal: Positive for back pain.  Psychiatric/Behavioral: Positive for depression and suicidal ideas.  All other systems reviewed and are negative.   Blood pressure (!) 130/93, pulse 79, temperature 98.4 F (36.9 C), temperature source Oral, resp. rate 18, height _0  (1.905 m), weight 128.8 kg (284 lb), SpO2 99 %.Body mass index is 35.5 kg/m.  General Appearance: Casual  Eye Contact:  Good  Speech:  Slow  Volume:  Normal  Mood:  Depressed and Hopeless  Affect:  Flat  Thought Process:  Goal Directed  Orientation:  Full (Time, Place, and Person)  Thought Content:  WDL  Suicidal Thoughts:  Yes.  with intent/plan  Homicidal Thoughts:  No  Memory:  Immediate;   Fair Recent;   Fair Remote;   Fair  Judgement:  Fair  Insight:  Fair  Psychomotor Activity:  Psychomotor Retardation  Concentration:  Concentration: Fair and Attention Span: Fair  Recall:  [employer] of Knowledge:  Fair  Language:  Fair   Akathisia:  No  Handed:  Right  AIMS (if indicated):     Assets:  Communication Skills Desire for Improvement Financial Resources/Insurance Housing Intimacy Resilience Social Support  ADL's:  Intact  Cognition:  WNL  Sleep:  Number of Hours: 7     Treatment Plan Summary: Daily contact with patient to assess and evaluate symptoms and progress in treatment and Medication management   [name] is a 50 year old male with a history of schizoaffective disorder and multiple medical problems transfer from medical floor where he was hospitalized for acute on chronic kidney disease to treat depression and suicidal ideation.  1. Suicidal ideation. The patient is able to contract for safety in the hospital.  2. Mood. We will continue Zyprexa, Cymbalta and Wellbutrin for depression and mood stabilization. We we will continue Lamictal titration.  3. Chronic pain. He is on Oxycodone and Baclofen.  4. Diabetes. He is on Lantus, Novolog, ADA diet and SSI. Input from Diabetes Nurse Coordinators is greatly appreciated.  5. Dyslipidemia. He is on Lipitor.  6. GERD. He is on Pepcid.  7. HTN. He is on Coreg and Clonidine.  8. Osteomielitis/foot wound. We will continue Vibramycin and wound care.  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  10. Metabolic syndrome monitoring. Lipid profile and hemoglobin A1c are pending.   11. Disposition. He will be discharged to home with his wife. He will ollow [facility] with [facility].  Orson Slick, MD 06/28/2016, 1:12 PM

## 2016-06-28 NOTE — Consult Note (Signed)
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  Apply antibiotic ointment to ulcer base and cover with 4x4 and gauze wrap.  Secure with paper tape. Change twice daily.  Discussed POC with patient and bedside nurse.   Thank you, Dorna Bloom BSN, RN, [facility]

## 2016-06-28 NOTE — BHH Suicide Risk Assessment (Signed)
[facility] [place] INPATIENT:  Family/Significant Other Suicide Prevention Education  Suicide Prevention Education:  Education Completed; Neely Bester (wife) [phone], has been identified by the patient as the family member/significant other with whom the patient will be residing, and identified as the person(s) who will aid the patient in the event of a mental health crisis (suicidal ideations/suicide attempt).  With written consent from the patient, the family member/significant other has been provided the following suicide prevention education, prior to the and/or following the discharge of the patient. Wife reports that patient has become increasingly depressed. Wife reports that patient has been threatening to kill himself recently due to her stating she may want to end the relationship. Wife reports that due to the type of work she does, she in unable to spend a lot of time with him throughout the day. Wife reports she will continue to support patient in the best way she can.   The suicide prevention education provided includes the following:  Suicide risk factors  Suicide prevention and interventions  National Suicide Hotline telephone number  [facility] assessment telephone number  [facility] Emergency Assistance [place] and/or Residential Mobile Crisis Unit telephone number  Request made of family/significant other to:  Remove weapons (e.g., guns, rifles, knives), all items previously/currently identified as safety concern. Wife reports that patient has reported to her that he has access to a gun from a friend. Patient will not tell wife who this friend is. Patient does not have a gun/weapon in the home however. Wife is concerned due to patient repeatedly saying he is going to shot himself.    Remove drugs/medications (over-the-counter, prescriptions, illicit drugs), all items previously/currently identified as a safety concern.  The family member/significant  other verbalizes understanding of the suicide prevention education information provided.  The family member/significant other agrees to remove the items of safety concern listed above.  Ritik Stavola G. [place], [place] 06/28/2016, 3:50 PM

## 2016-06-28 NOTE — BHH Group Notes (Signed)
[place] LCSW Group Therapy Note  Type of Therapy and Topic:  Group Therapy:  Goals Group: SMART Goals  Participation Level:  Pt did not attend group. CSW invited pt to group.   Description of Group:   The purpose of a daily goals group is to assist and guide patients in setting recovery/wellness-related goals.  The objective is to set goals as they relate to the crisis in which they were admitted. Patients will be using SMART goal modalities to set measurable goals.  Characteristics of realistic goals will be discussed and patients will be assisted in setting and processing how one will reach their goal. Facilitator will also assist patients in applying interventions and coping skills learned in psycho-education groups to the SMART goal and process how one will achieve defined goal.  Therapeutic Goals: -Patients will develop and document one goal related to or their crisis in which brought them into treatment. -Patients will be guided by LCSW using SMART goal setting modality in how to set a measurable, attainable, realistic and time sensitive goal.  -Patients will process barriers in reaching goal. -Patients will process interventions in how to overcome and successful in reaching goal.   Summary of Patient Progress:  Patient Goal: Pt did not attend group. CSW invited pt to group.    Therapeutic Modalities:   Motivational Interviewing Public relations account executive Therapy Crisis Intervention Model SMART goals setting  Aleta Manternach G. [place], [facility] 06/28/2016 12:04 PM

## 2016-06-28 NOTE — Progress Notes (Signed)
A&Ox3, c/o 7/10 back pain, hydrocodone (Norco) 2 tablets given @ 2150 with partial relief on re-assessment, CBG=218, 2 U insulin aspart given per SSI; patient denied SI/HI at this time, denied AV/H.

## 2016-06-28 NOTE — Plan of Care (Signed)
Problem: Safety: Goal: Ability to remain free from injury will improve Outcome: Progressing Patient has remained free from injury this shift.  Patient provided with a safe environment.  Will continue to monitor.

## 2016-06-29 LAB — CBC
HCT: 36.1 % — ABNORMAL LOW (ref 40.0–52.0)
Hemoglobin: 13 g/dL (ref 13.0–18.0)
MCH: 31 pg (ref 26.0–34.0)
MCHC: 35.9 g/dL (ref 32.0–36.0)
MCV: 86.3 fL (ref 80.0–100.0)
PLATELETS: 272 10*3/uL (ref 150–440)
RBC: 4.18 MIL/uL — ABNORMAL LOW (ref 4.40–5.90)
RDW: 13.1 % (ref 11.5–14.5)
WBC: 5.5 10*3/uL (ref 3.8–10.6)

## 2016-06-29 LAB — GLUCOSE, CAPILLARY
Glucose-Capillary: 148 mg/dL — ABNORMAL HIGH (ref 65–99)
Glucose-Capillary: 254 mg/dL — ABNORMAL HIGH (ref 65–99)

## 2016-06-29 LAB — BASIC METABOLIC PANEL
Anion gap: 6 (ref 5–15)
BUN: 16 mg/dL (ref 6–20)
CO2: 27 mmol/L (ref 22–32)
CREATININE: 1.64 mg/dL — AB (ref 0.61–1.24)
Calcium: 8.6 mg/dL — ABNORMAL LOW (ref 8.9–10.3)
Chloride: 104 mmol/L (ref 101–111)
GFR calc Af Amer: 56 mL/min — ABNORMAL LOW (ref >60)
GFR, EST NON AFRICAN AMERICAN: 48 mL/min — AB (ref >60)
GLUCOSE: 166 mg/dL — AB (ref 65–99)
Potassium: 4 mmol/L (ref 3.5–5.1)
SODIUM: 137 mmol/L (ref 135–145)

## 2016-06-29 MED ORDER — ATORVASTATIN CALCIUM 20 MG PO TABS: 80.0000 mg | ORAL_TABLET | Freq: Every day | ORAL | Status: DC

## 2016-06-29 MED ORDER — TRAZODONE HCL 100 MG PO TABS: 100.0000 mg | ORAL_TABLET | Freq: Every day | ORAL | 1 refills | Status: AC

## 2016-06-29 MED ORDER — BACITRACIN-NEOMYCIN-POLYMYXIN OINTMENT TUBE: 1.0000 "application " | TOPICAL_OINTMENT | Freq: Two times a day (BID) | CUTANEOUS | 1 refills | Status: DC

## 2016-06-29 MED ORDER — DOXYCYCLINE HYCLATE 100 MG PO TABS: 100.0000 mg | ORAL_TABLET | Freq: Two times a day (BID) | ORAL | 0 refills | Status: DC

## 2016-06-29 MED ORDER — FENOFIBRATE 160 MG PO TABS: 160.0000 mg | ORAL_TABLET | Freq: Every day | ORAL | 1 refills | Status: AC

## 2016-06-29 MED ORDER — LAMOTRIGINE 25 MG PO TABS: 25.0000 mg | ORAL_TABLET | Freq: Every day | ORAL | 1 refills | Status: DC

## 2016-06-29 MED ORDER — FENOFIBRATE 160 MG PO TABS
160.0000 mg | ORAL_TABLET | Freq: Every day | ORAL | Status: DC
  Administered 2016-06-29: 160 mg via ORAL
  Filled 2016-06-29: qty 1

## 2016-06-29 MED ORDER — COLLAGENASE 250 UNIT/GM EX OINT: TOPICAL_OINTMENT | Freq: Every day | CUTANEOUS | 0 refills | Status: DC

## 2016-06-29 MED ORDER — LAMOTRIGINE 25 MG PO TABS: 25.0000 mg | ORAL_TABLET | Freq: Every day | ORAL | 1 refills | Status: AC

## 2016-06-29 MED ORDER — INSULIN ASPART 100 UNIT/ML ~~LOC~~ SOLN
7.0000 [IU] | Freq: Three times a day (TID) | SUBCUTANEOUS | Status: DC
  Administered 2016-06-29: 7 [IU] via SUBCUTANEOUS
  Filled 2016-06-29: qty 7

## 2016-06-29 MED ORDER — INSULIN ASPART 100 UNIT/ML ~~LOC~~ SOLN: 0.0000 [IU] | Freq: Three times a day (TID) | SUBCUTANEOUS | 11 refills | Status: AC

## 2016-06-29 MED ORDER — ATORVASTATIN CALCIUM 80 MG PO TABS: 80.0000 mg | ORAL_TABLET | Freq: Every day | ORAL | 1 refills | Status: AC

## 2016-06-29 MED ORDER — TRAZODONE HCL 100 MG PO TABS: 100.0000 mg | ORAL_TABLET | Freq: Every day | ORAL | 1 refills | Status: DC

## 2016-06-29 NOTE — Progress Notes (Signed)
[name] was admitted to medical floor for foot ulcer and renal failure and then transferred to Psych unit for treatment of depression. Was asked to address the hypertriglyceridemia- TG of 700 Patient on atorvastatin at home- not sure of the compliance history Clinically no signs or symptoms of pancreatitis noted.  Recommendations:  - Increase atorvastatin to 80mg  qdaily - added fenofibrate - strongly encouarge to cut calories during meals - especially a low carbohydrate diet is essential- avoid high glycemic foods, processed foods, refined sugars, fruit juices, high fructose beverages - can consume fish and omega 3 fatty acid rich foods - weight loss and life style modifications- avoid smoking, physical execise - recheck lipid in 6 months.

## 2016-06-29 NOTE — BHH Group Notes (Signed)
[place] Group Notes:  (Nursing/MHT/Case Management/Adjunct)  Date:  06/29/2016  Time:  1:45 AM  Type of Therapy:  Group Therapy  Participation Level:  Did Not Attend    [name] 06/29/2016, 1:45 AM

## 2016-06-29 NOTE — Progress Notes (Signed)
D: Patient is alert and oriented on the unit this shift. Patient does not attend  And  participated in groups today. Patient denies suicidal ideation, homicidal ideation, auditory or visual hallucinations at the present time.  A: Scheduled medications are administered to patient as per MD orders. Emotional support and encouragement are provided. Patient is maintained on q.15 minute safety checks. Patient is informed to notify staff with questions or concerns. R: No adverse medication reactions are noted. Patient is cooperative with medication administration and treatment plan today. Patient is receptive, calm and cooperative on the unit at this time. Patient does not  Interact with others on the unit this shift. Patient contracts for safety at this time. Patient remains safe at this time.

## 2016-06-29 NOTE — Progress Notes (Signed)
Patient ID: [name], male   DOB: 02/10/1966, 50 y.o.   MRN: [id number]  CSW contacted patient's wife, Savant Vuolo to inform her that patient will be discharging today. CSW will conduct family session with patient and his wife to discuss patients aftercare plan. Suicide prevention education will be discussed again with both patient and wife. Patient and wife will be encouraged to ask questions if they need clarity on aftercare plan.   Mayzee Reichenbach G. [place] Jabs MSW, [place] 06/29/2016 11:22 AM

## 2016-06-29 NOTE — Progress Notes (Signed)
  [facility] Adult Case Management Discharge Plan :  Will you be returning to the same living situation after discharge:  Yes,  pt will be returning home to [place] to live with his wife At discharge, do you have transportation home?: Yes,  pt will be picked up by his wife Do you have the ability to pay for your medications: Yes,  pt will be provided with prescriptions upon discharge  Release of information consent forms completed and in the chart;  Patient's signature needed at discharge.  Patient to Follow up at: Follow-up Information    [facility] .   Why:  Please arrive at 11am on Monday September 18th Monday to meet with Dr. Norina Buzzard for medication management and for an assessment for therapy Contact information: [address]  [place], [place] [address] Ph: [phone] Fax: [phone]          Next level of care provider has access to [place] and Suicide Prevention discussed: Yes,  completed with pt  Have you used any form of tobacco in the last 30 days? (Cigarettes, Smokeless Tobacco, Cigars, and/or Pipes): No  Has patient been referred to the Quitline?: N/A patient is not a smoker  Patient has been referred for addiction treatment: N/A  Claudine Mouton 06/29/2016, 1:31 PM

## 2016-06-29 NOTE — BHH Suicide Risk Assessment (Signed)
[facility] Discharge Suicide Risk Assessment   Principal Problem: Schizoaffective disorder, bipolar type [facility]) Discharge Diagnoses:  Patient Active Problem List   Diagnosis Date Noted  . DM (diabetes mellitus) type 2, uncontrolled, with ketoacidosis ([place]) [E13.10] 06/26/2016  . Dyslipidemia [E78.5] 06/26/2016  . Essential hypertension [I10] 06/26/2016  . Osteomyelitis ([place]) [M86.9] 06/26/2016  . GERD (gastroesophageal reflux disease) [K21.9] 06/26/2016  . Chronic pain syndrome [G89.4] 06/26/2016  . Acute on chronic kidney failure ([place]) [N17.9, N18.9] 06/25/2016  . Schizoaffective disorder, bipolar type ([place]) [F25.0] 06/25/2016  . Suicidal ideation [R45.851] 06/25/2016    Total Time spent with patient: 30 minutes  Musculoskeletal: Strength & Muscle Tone: within normal limits Gait & Station: normal Patient leans: N/A  Psychiatric Specialty Exam: Review of Systems  All other systems reviewed and are negative.   Blood pressure 118/74, pulse 82, temperature 98.1 F (36.7 C), temperature source Oral, resp. rate 18, height 6\' 3"  (1.905 m), weight 128.8 kg (284 lb), SpO2 99 %.Body mass index is 35.5 kg/m.  General Appearance: Casual  Eye Contact::  Good  Speech:  Clear and [id number]  Volume:  Normal  Mood:  Euthymic  Affect:  Appropriate  Thought Process:  Goal Directed  Orientation:  Full (Time, Place, and Person)  Thought Content:  WDL  Suicidal Thoughts:  No  Homicidal Thoughts:  No  Memory:  Immediate;   Fair Recent;   Fair Remote;   Fair  Judgement:  Fair  Insight:  Fair  Psychomotor Activity:  Normal  Concentration:  Fair  Recall:  [employer] of [place]  Language: Fair  Akathisia:  No  Handed:  Right  AIMS (if indicated):     Assets:  Communication Skills Desire for Improvement Financial Resources/Insurance Housing Intimacy Resilience Social Support  Sleep:  Number of Hours: 8  Cognition: WNL  ADL's:  Intact   Mental Status Per Nursing Assessment::    On Admission:     Demographic Factors:  Male and Caucasian  Loss Factors: NA  Historical Factors: Impulsivity  Risk Reduction Factors:   Sense of responsibility to family, Living with another person, especially a relative, Positive social support and Positive therapeutic relationship  Continued Clinical Symptoms:  Bipolar Disorder:   Depressive phase Schizophrenia:   Depressive state  Cognitive Features That Contribute To Risk:  None    Suicide Risk:  Minimal: No identifiable suicidal ideation.  Patients presenting with no risk factors but with morbid ruminations; may be classified as minimal risk based on the severity of the depressive symptoms  Follow-up Information    [facility] DO NOT SEND NOT COMPLETE .   Why:  DO NOT SEND NOT COMPLETE.  Please arrive at 8am on Monday, September ___th for your hospital follow up appt.  Please bring your ID, social security card, insurance card, proof of income (or awards letter) Sport and exercise psychologist information: Arlana Lindau [address] [place], [place] [address] Ph: [phone] Fax: [phone]          Plan Of Care/Follow-up recommendations:  Activity:  as tolerated. Diet:  low sodium heart healthy ADA diet. Other:  keep follow up appointment.  Orson Slick, MD 06/29/2016, 8:49 AM

## 2016-06-29 NOTE — [facility] Group Notes (Signed)
[facility] LCSW Group Therapy  06/29/2016 11:48 AM  Type of Therapy:  Group Therapy  Participation Level:  Patient did not attend group. CSW invited patient to group.   Summary of Progress/Problems:Feelings around Relapse. Group members discussed the meaning of relapse and shared personal stories of relapse, how it affected them and others, and how they perceived themselves during this time. Group members were encouraged to identify triggers, warning signs and coping skills used when facing the possibility of relapse. Social supports were discussed and explored in detail. Patients also discussed facing disappointment and how that can trigger someone to relapse.   Blimi Godby G. [place], [place] 06/29/2016, 11:48 AM

## 2016-06-29 NOTE — Progress Notes (Signed)
   Diabetes history:Type 2 Outpatient Diabetes medications: Levemir 45 units bid, Novolog 20-30 units tid with meals  Current orders for Inpatient glycemic control: Lantus 30units bid, Novolog 5 units tid with meals, Novolog 0-20 units tid, Novolog 0-5 units qhs  Inpatient Diabetes Program Recommendations: Post prandial blood sugars elevated despite Novolog 5 units tid and Novolog correction.  Consider increasing Novolog mealtime to 7 units tid - continue Novolog correction as ordered.   Gentry Fitz, RN, BA, MHA, CDE Diabetes Coordinator Inpatient Diabetes Program  [phone] (Team Pager) [phone] ([place]) 06/29/2016 8:01 AM

## 2016-06-29 NOTE — Progress Notes (Signed)
Patient discharged home. DC instructions provided and explained. Medications reviewed. Rx given. All questions answered. Pt stable at discharge. Denies SI, HI, AVH.

## 2016-06-29 NOTE — Discharge Summary (Signed)
Physician Discharge Summary Note  Patient:  [name] is an 50 y.o., male MRN:  [id number] DOB:  [birth year] Patient phone:  [phone] (home)  Patient address:   [address] [place] [address],  Total Time spent with patient: 30 minutes  Date of Admission:  06/26/2016 Date of Discharge: 06/29/2016  Reason for Admission:  Suicidal ideation.  Identifying data. [name] is a 50 year old male with a history of schizoaffective disorder and chronic pain.  Chief complaint. "I'm suicidal."  History of present illness. Information was obtained from the patient and the chart. The patient has a long history of depression and mood instability. He has been maintained on a combination of Cymbalta, Wellbutrin, and Zyprexa with success prescribed by [name] step clinic. In the past several weeks however the patient became increasingly depressed with poor sleep, decreased appetite, anhedonia, feeling of guilt and hopelessness worthlessness, poor energy and concentration, crying spells, social isolation, heightened anxiety, and suicidal thinking. He came to the hospital asking for help. He has been compliant with treatment. He is unable to identify any new stressors except that ended chronic wound on his foot that already underwent surgery has recently opened. The patient was briefly hospitalized on medical floor to treat infection. He denies psychotic symptoms or symptoms suggestive of bipolar mania. He reports panic attacks 3 times a week, social anxiety, and OCD type symptoms that have not been exacerbated lately. He denies alcohol or illicit substance or prescription pill abuse. He has chronic pain that has been treated by [facility] pain clinic.  Past psychiatric history. He was hospitalized 3 times before for manic and depressive episodes. He never attempted suicide. He has been tried on every antidepressant I name except for Remeron. He has been tried on Seroquel and Depakote before.  Family  psychiatric history. Mother with bipolar illness.  Social history. He lives with his wife. He is disabled from mental illness. He suffers chronic pain.  Principal Problem: Schizoaffective disorder, bipolar type [facility]) Discharge Diagnoses: Patient Active Problem List   Diagnosis Date Noted  . DM (diabetes mellitus) type 2, uncontrolled, with ketoacidosis ([place]) [E13.10] 06/26/2016  . Hypertriglyceridemia [E78.1] 06/26/2016  . Essential hypertension [I10] 06/26/2016  . Osteomyelitis ([place]) [M86.9] 06/26/2016  . GERD (gastroesophageal reflux disease) [K21.9] 06/26/2016  . Chronic pain syndrome [G89.4] 06/26/2016  . Acute on chronic kidney failure ([place]) [N17.9, N18.9] 06/25/2016  . Schizoaffective disorder, bipolar type ([place]) [F25.0] 06/25/2016  . Suicidal ideation [R45.851] 06/25/2016    Past Medical History:  Past Medical History:  Diagnosis Date  . Chronic kidney disease (CKD), stage III (moderate)    Cr was 2.15 (02/2016)  . Chronic pain   . Diabetes mellitus    insulin-dependent  . Hyperlipemia   . Hypertension   . Schizophrenia [facility])     Past Surgical History:  Procedure Laterality Date  . AMPUTATION TOE Left 2014  . APPENDECTOMY     Family History: History reviewed. No pertinent family history.  Social History:  History  Alcohol Use No    Comment: occ     History  Drug Use No    Social History   Social History  . Marital status: Married    Spouse name: N/A  . Number of children: N/A  . Years of education: N/A   Social History Main Topics  . Smoking status: Never Smoker  . Smokeless tobacco: Never Used  . Alcohol use No     Comment: occ  . Drug use: No  .  Sexual activity: Yes    Birth control/ protection: None   Other Topics Concern  . None   Social History Narrative  . None    Hospital Course:    [name] is a 50 year old male with a history of schizoaffective disorder and multiple medical problems transfer from medical floor where he was  hospitalized for acute on chronic kidney disease to treat depression and suicidal ideation.  1. Suicidal ideation. This has resolved. The patient is able to contract for safety. He is forward thinking and optimistic about the future.  2. Mood. We continued Zyprexa, Cymbalta and Wellbutrin for depression and mood stabilization. We added Lamictal for further mood stabilization. He will continue Lamictal titration in the community.  3. Chronic pain. He is on Oxycodone and Baclofen.  4. Diabetes. He is on Lantus, Novolog, ADA diet and SSI. Input from Diabetes Nurse Coordinators is greatly appreciated.  5. Dyslipidemia. His triglycerides were elevated at 707. Medicine consult is greatly appreciated.  - Increase atorvastatin to 80mg  qdaily - added fenofibrate - strongly encouarge to cut calories during meals - especially a low carbohydrate diet is essential- avoid high glycemic foods, processed foods, refined sugars, fruit juices, high fructose beverages - can consume fish and omega 3 fatty acid rich foods - weight loss and life style modifications- avoid smoking, physical execise - recheck lipid in 6 months.  6. GERD. He is on Pepcid.  7. HTN. He is on Coreg and Clonidine.  8. Osteomielitis/foot wound. He will continue Vibramycin and wound care with Neosporin.  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  10. Metabolic syndrome monitoring. Lipid profile is elevated, hemoglobin A1c 13.9.   39. Social. His wife has been threatening divorce. The patient is familiar with the situation and believes that they can "work things out" and believes he can handle the situation. He has no access to guns.   12. EKG. Normal EKG. QT 410.  13. Disposition. He was discharged to home with his wife. He will ollow up with [facility].  Physical Findings: AIMS: Facial and Oral Movements Muscles of Facial Expression: None, normal Lips and Perioral Area: None, normal Jaw: None, normal Tongue: None,  normal,Extremity Movements Upper (arms, wrists, hands, fingers): None, normal Lower (legs, knees, ankles, toes): None, normal, Trunk Movements Neck, shoulders, hips: None, normal, Overall Severity Severity of abnormal movements (highest score from questions above): None, normal Incapacitation due to abnormal movements: None, normal Patient's awareness of abnormal movements (rate only patient's report): No Awareness, Dental Status Current problems with teeth and/or dentures?: No Does patient usually wear dentures?: No  CIWA:    COWS:     Musculoskeletal: Strength & Muscle Tone: within normal limits Gait & Station: normal Patient leans: N/A  Psychiatric Specialty Exam: Physical Exam  Nursing note and vitals reviewed.   Review of Systems  All other systems reviewed and are negative.   Blood pressure 118/74, pulse 82, temperature 98.1 F (36.7 C), temperature source Oral, resp. rate 18, height 6\' 3"  (1.905 m), weight 128.8 kg (284 lb), SpO2 99 %.Body mass index is 35.5 kg/m.  See SRA.                                                  Sleep:  Number of Hours: 8     Have you used any form of tobacco in the  last 30 days? (Cigarettes, Smokeless Tobacco, Cigars, and/or Pipes): No  Has this patient used any form of tobacco in the last 30 days? (Cigarettes, Smokeless Tobacco, Cigars, and/or Pipes) Yes, Yes, A prescription for an FDA-approved tobacco cessation medication was offered at discharge and the patient refused  Blood Alcohol level:  Lab Results  Component Value Date   [facility] <5 06/24/2016   [facility]  01/27/2011    <5        LOWEST DETECTABLE LIMIT FOR SERUM ALCOHOL IS 5 mg/dL FOR MEDICAL PURPOSES ONLY    Metabolic Disorder Labs:  Lab Results  Component Value Date   HGBA1C 13.9 (H) 06/28/2016   MPG 324 (H) 01/28/2011   MPG 157 11/01/2008   No results found for: PROLACTIN Lab Results  Component Value Date   CHOL 309 (H) 06/28/2016   TRIG 707 (H)  06/28/2016   HDL 34 (L) 06/28/2016   CHOLHDL 9.1 06/28/2016   VLDL UNABLE TO CALCULATE IF TRIGLYCERIDE OVER 400 mg/dL 06/28/2016   LDLCALC UNABLE TO CALCULATE IF TRIGLYCERIDE OVER 400 mg/dL 06/28/2016   LDLCALC  11/16/2008    95        Total Cholesterol/HDL:CHD Risk Coronary Heart Disease Risk Table                     Men   Women  1/2 Average Risk   3.4   3.3  Average Risk       5.0   4.4  2 X Average Risk   9.6   7.1  3 X Average Risk  23.4   11.0        Use the calculated Patient Ratio above and the CHD Risk Table to determine the patient's CHD Risk.        ATP III CLASSIFICATION (LDL):  <100     mg/dL   Optimal  100-129  mg/dL   Near or Above                    Optimal  130-159  mg/dL   Borderline  160-189  mg/dL   High  >190     mg/dL   Very High    See Psychiatric Specialty Exam and Suicide Risk Assessment completed by Attending Physician prior to discharge.  Discharge destination:  Home  Is patient on multiple antipsychotic therapies at discharge:  No   Has Patient had three or more failed trials of antipsychotic monotherapy by history:  No  Recommended Plan for Multiple Antipsychotic Therapies: NA  Discharge Instructions    Diet - low sodium heart healthy    Complete by:  As directed   Increase activity slowly    Complete by:  As directed       Medication List    STOP taking these medications   amLODipine 5 MG tablet Commonly known as:  NORVASC   collagenase ointment Commonly known as:  SANTYL     TAKE these medications     Indication  atorvastatin 80 MG tablet Commonly known as:  LIPITOR Take 1 tablet (80 mg total) by mouth daily at 6 PM. What changed:  medication strength  how much to take  when to take this  Indication:  High Amount of Triglycerides in the Blood   baclofen 10 MG tablet Commonly known as:  LIORESAL Take 10 mg by mouth 3 (three) times daily.  Indication:  Muscle Spasticity   buPROPion 150 MG 12 hr tablet Commonly  known as:  ZYBAN  Take 150 mg by mouth 2 (two) times daily.  Indication:  Treatment to Stop Smoking   carvedilol 25 MG tablet Commonly known as:  COREG Take 25 mg by mouth 2 (two) times daily with a meal.  Indication:  Chronic Angina Following Exercise, Stress, or Excitement   cloNIDine 0.2 mg/24hr patch Commonly known as:  CATAPRES - Dosed in mg/24 hr Place 1 patch (0.2 mg total) onto the skin once a week.  Indication:  High Blood Pressure Disorder   doxycycline 100 MG tablet Commonly known as:  VIBRA-TABS Take 1 tablet (100 mg total) by mouth every 12 (twelve) hours. X 7 days  Indication:  Skin and Soft Tissue Infection   DULoxetine 60 MG capsule Commonly known as:  CYMBALTA Take 120 mg by mouth daily.  Indication:  Major Depressive Disorder   famotidine 20 MG tablet Commonly known as:  PEPCID Take 20 mg by mouth at bedtime.  Indication:  Gastroesophageal Reflux Disease   fenofibrate 160 MG tablet Take 1 tablet (160 mg total) by mouth daily.  Indication:  High Amount of Triglycerides in the Blood   HYDROcodone-acetaminophen 10-325 MG tablet Commonly known as:  NORCO Take 1 tablet by mouth every 6 (six) hours as needed.  Indication:  Moderate to Moderately Severe Pain   insulin aspart 100 UNIT/ML injection Commonly known as:  novoLOG Inject 5 Units into the skin 3 (three) times daily with meals. What changed:  Another medication with the same name was added. Make sure you understand how and when to take each.  Indication:  Type 2 Diabetes   insulin aspart 100 UNIT/ML injection Commonly known as:  novoLOG Inject 0-20 Units into the skin 3 (three) times daily with meals. What changed:  You were already taking a medication with the same name, and this prescription was added. Make sure you understand how and when to take each.  Indication:  Type 2 Diabetes   insulin glargine 100 unit/mL Sopn Commonly known as:  LANTUS Inject 0.3 mLs (30 Units total) into the skin 2  (two) times daily.  Indication:  Type 2 Diabetes   lamoTRIgine 25 MG tablet Commonly known as:  LAMICTAL Take 1 tablet (25 mg total) by mouth at bedtime.  Indication:  Manic-Depression   neomycin-bacitracin-polymyxin Oint Commonly known as:  NEOSPORIN Apply 1 application topically 2 (two) times daily.  Indication:  infection   OLANZapine zydis 20 MG disintegrating tablet Commonly known as:  ZYPREXA Take 20 mg by mouth at bedtime.  Indication:  Schizophrenia   OLANZapine zydis 5 MG disintegrating tablet Commonly known as:  ZYPREXA Take 5 mg by mouth at bedtime.  Indication:  Schizophrenia   rivaroxaban 20 MG Tabs tablet Commonly known as:  XARELTO Take 20 mg by mouth daily with supper.  Indication:  Blood Clot in a Deep Vein   traZODone 100 MG tablet Commonly known as:  DESYREL Take 1 tablet (100 mg total) by mouth at bedtime.  Indication:  [facility] .   Contact information: [address]  [place],  [address] Ph: [phone] Fax: [phone].          Follow-up recommendations:  Activity:  As tolerated. Diet:  Low sodium heart healthy ADA diet. Other:  Keep follow-up appointments.  Comments:    Signed: Orson Slick, MD 06/29/2016, 11:32 AM

## 2016-06-29 NOTE — Plan of Care (Signed)
Problem: Pain Managment: Goal: General experience of comfort will improve Outcome: Progressing Patient instructEd to take pain regularly to stay ahead of thE pain  As  He is doing Orthoptist

## 2016-07-02 LAB — AEROBIC/ANAEROBIC CULTURE W GRAM STAIN (SURGICAL/DEEP WOUND)

## 2016-07-02 LAB — AEROBIC/ANAEROBIC CULTURE (SURGICAL/DEEP WOUND): SPECIAL REQUESTS: NORMAL

## 2016-09-04 ENCOUNTER — Other Ambulatory Visit: Payer: Self-pay | Admitting: Psychiatry

## 2018-03-11 IMAGING — DX DG FOOT 2V*R*
2 series · 2 of 2 positions shown · non-contrast
Comparison: 11/01/2008

CLINICAL DATA: Recurring ulcer on the bottom of the right foot for
6 months. History of diabetes, peripheral neuropathy, foot ulcers,
left toe amputation.

EXAM:
RIGHT FOOT - 2 VIEW

[foot ap]
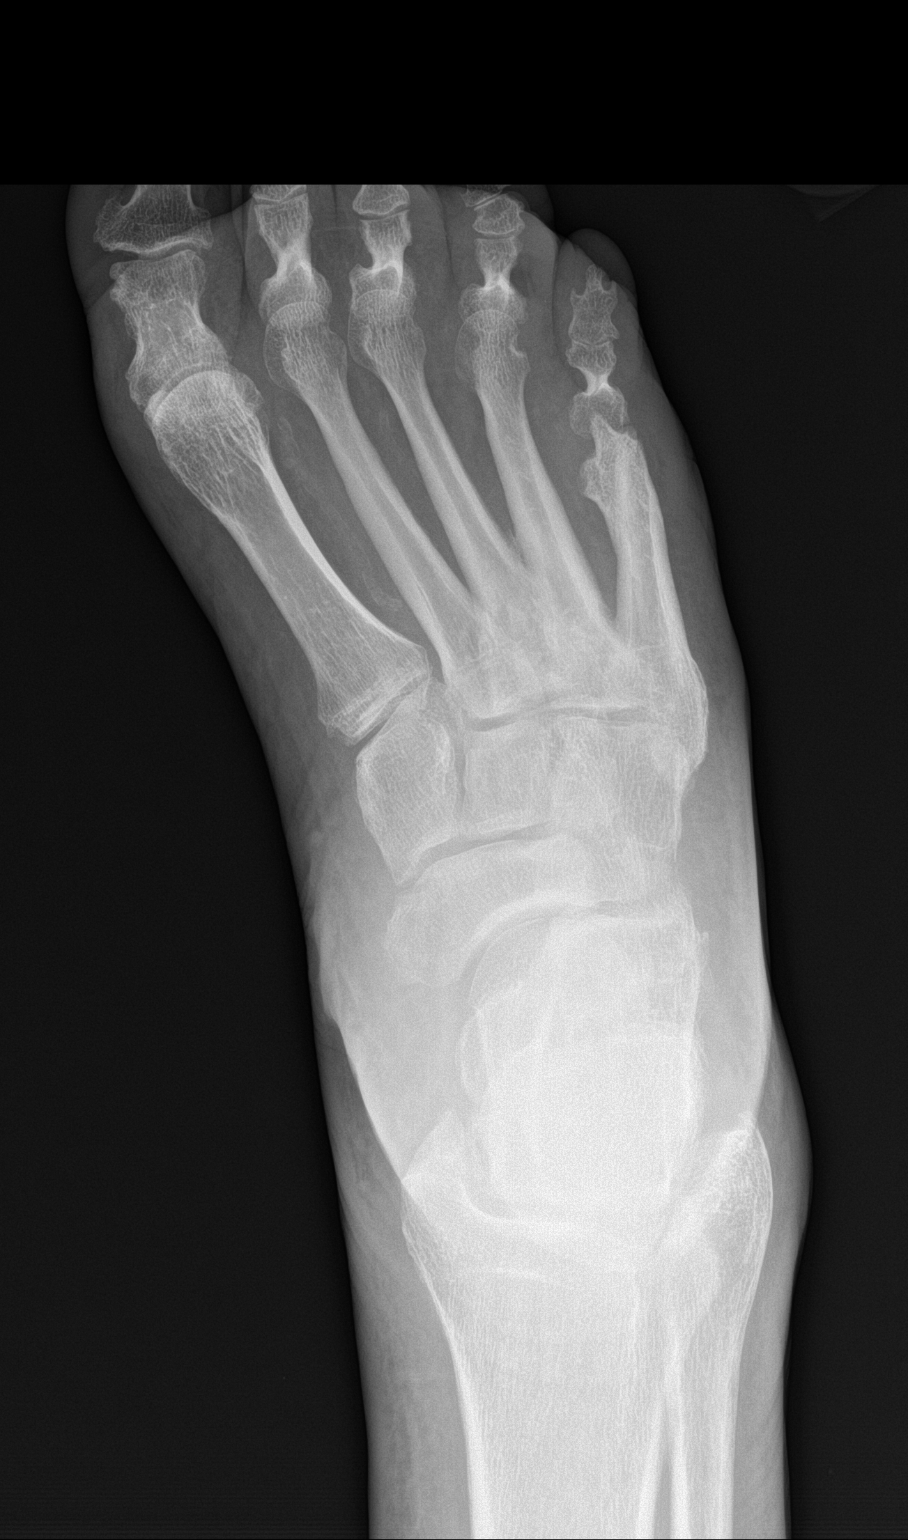

[foot lat]
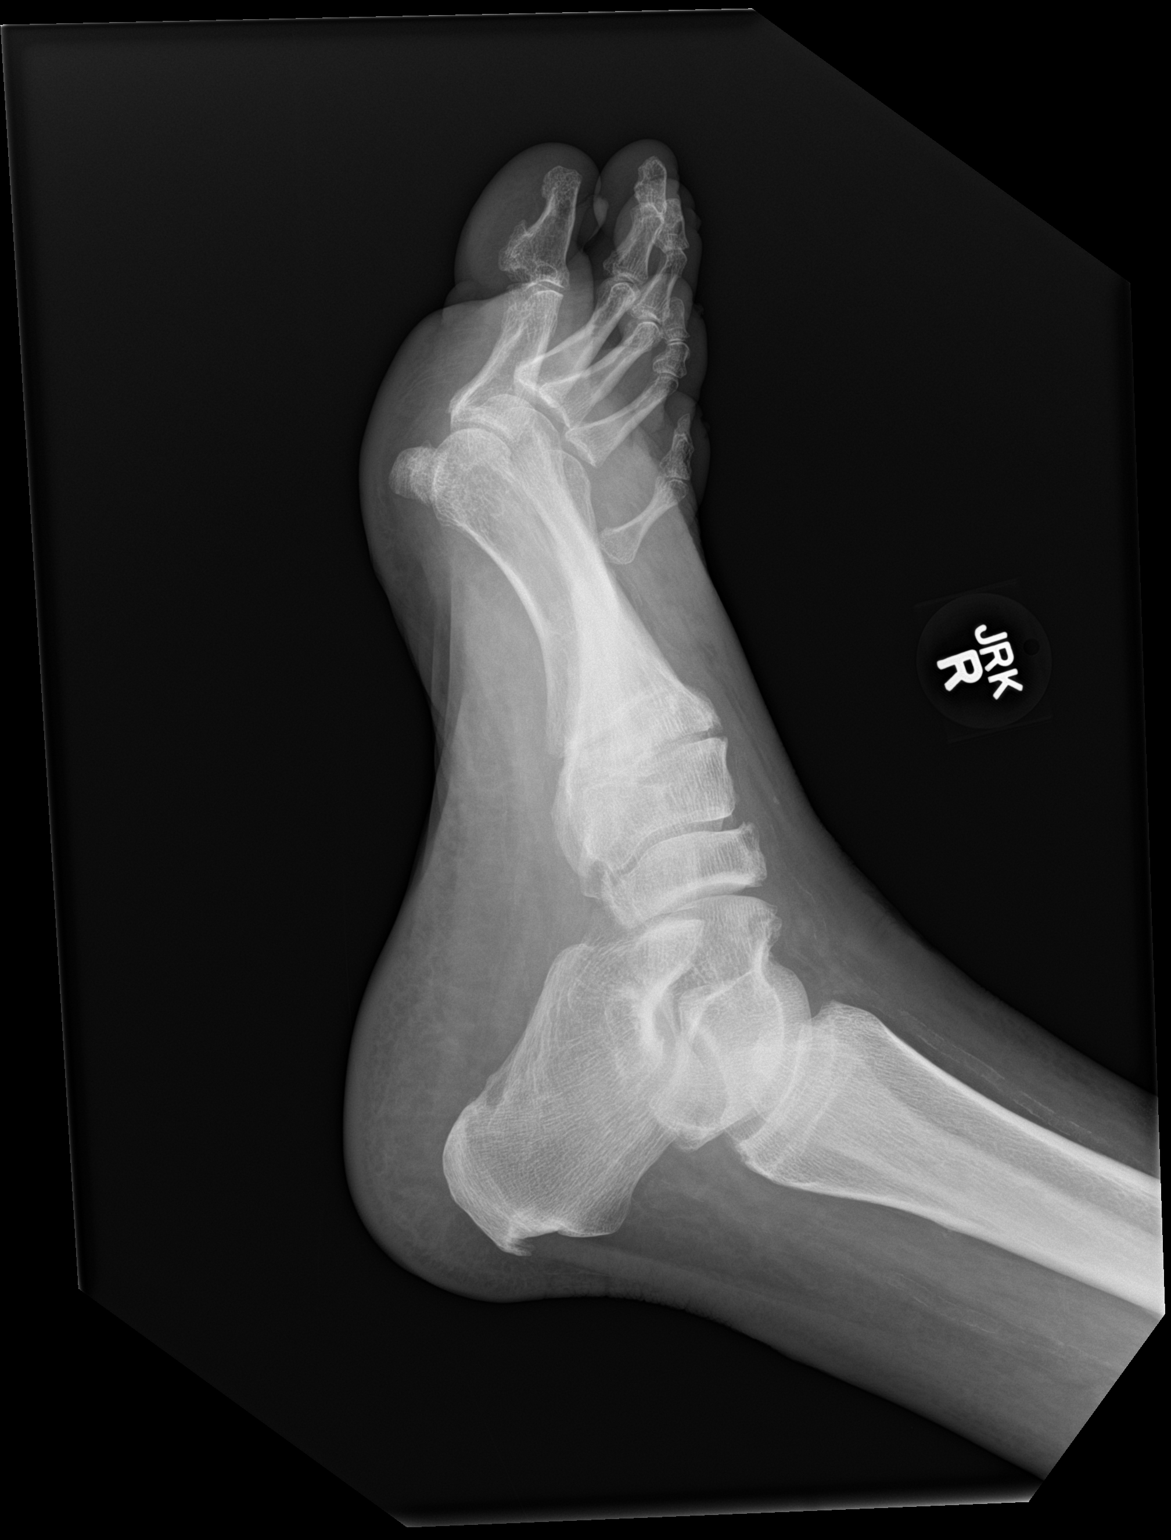

[2 of 2 positions shown; findings below may reference images not displayed]

FINDINGS: Since the previous study, there is interval development of
osteolysis at the distal right fifth metatarsal bone. This could
represent osteomyelitis. No acute fractures are demonstrated.
Degenerative changes in the interphalangeal and intertarsal joints.
Vascular calcifications.
IMPRESSION: Bone destruction along the distal fifth metatarsal bone could
indicate osteomyelitis in the appropriate clinical setting.

## 2018-08-29 DIAGNOSIS — I639 Cerebral infarction, unspecified: Secondary | ICD-10-CM

## 2018-08-29 HISTORY — DX: Cerebral infarction, unspecified: I63.9

## 2018-11-29 HISTORY — PX: TOE AMPUTATION: SHX809

## 2019-01-06 ENCOUNTER — Other Ambulatory Visit (HOSPITAL_COMMUNITY)
Admission: RE | Admit: 2019-01-06 | Discharge: 2019-01-06 | Disposition: A | Discharge status: Home / Self Care | Payer: Medicaid Other | Source: Skilled Nursing Facility | Attending: Infectious Diseases | Admitting: Infectious Diseases

## 2019-01-06 DIAGNOSIS — M86171 Other acute osteomyelitis, right ankle and foot: Secondary | ICD-10-CM | POA: Insufficient documentation

## 2019-01-06 LAB — CBC WITH DIFFERENTIAL/PLATELET
ABS IMMATURE GRANULOCYTES: 0.02 10*3/uL (ref 0.00–0.07)
BASOS ABS: 0 10*3/uL (ref 0.0–0.1)
BASOS PCT: 1 %
EOS ABS: 0.2 10*3/uL (ref 0.0–0.5)
Eosinophils Relative: 3 %
HCT: 22.8 % — ABNORMAL LOW (ref 39.0–52.0)
Hemoglobin: 7 g/dL — ABNORMAL LOW (ref 13.0–17.0)
IMMATURE GRANULOCYTES: 0 %
LYMPHS ABS: 0.9 10*3/uL (ref 0.7–4.0)
Lymphocytes Relative: 13 %
MCH: 28.8 pg (ref 26.0–34.0)
MCHC: 30.7 g/dL (ref 30.0–36.0)
MCV: 93.8 fL (ref 80.0–100.0)
Monocytes Absolute: 0.5 10*3/uL (ref 0.1–1.0)
Monocytes Relative: 8 %
NEUTROS ABS: 4.7 10*3/uL (ref 1.7–7.7)
NEUTROS PCT: 75 %
NRBC: 0 % (ref 0.0–0.2)
PLATELETS: 341 10*3/uL (ref 150–400)
RBC: 2.43 MIL/uL — ABNORMAL LOW (ref 4.22–5.81)
RDW: 13.6 % (ref 11.5–15.5)
WBC: 6.4 10*3/uL (ref 4.0–10.5)

## 2019-01-22 ENCOUNTER — Encounter (INDEPENDENT_AMBULATORY_CARE_PROVIDER_SITE_OTHER): Payer: Medicaid Other | Admitting: Vascular Surgery

## 2019-01-22 ENCOUNTER — Other Ambulatory Visit (INDEPENDENT_AMBULATORY_CARE_PROVIDER_SITE_OTHER): Payer: Medicaid Other

## 2019-01-22 ENCOUNTER — Encounter (INDEPENDENT_AMBULATORY_CARE_PROVIDER_SITE_OTHER): Payer: Medicaid Other

## 2019-02-27 ENCOUNTER — Other Ambulatory Visit (INDEPENDENT_AMBULATORY_CARE_PROVIDER_SITE_OTHER): Payer: Self-pay | Admitting: Vascular Surgery

## 2019-02-27 DIAGNOSIS — N186 End stage renal disease: Secondary | ICD-10-CM

## 2019-03-02 ENCOUNTER — Other Ambulatory Visit: Payer: Self-pay

## 2019-03-02 ENCOUNTER — Telehealth (INDEPENDENT_AMBULATORY_CARE_PROVIDER_SITE_OTHER): Payer: Self-pay

## 2019-03-02 ENCOUNTER — Ambulatory Visit (INDEPENDENT_AMBULATORY_CARE_PROVIDER_SITE_OTHER): Payer: Medicaid Other | Admitting: Vascular Surgery

## 2019-03-02 ENCOUNTER — Encounter (INDEPENDENT_AMBULATORY_CARE_PROVIDER_SITE_OTHER): Payer: Self-pay | Admitting: Vascular Surgery

## 2019-03-02 ENCOUNTER — Ambulatory Visit (INDEPENDENT_AMBULATORY_CARE_PROVIDER_SITE_OTHER): Payer: Medicaid Other

## 2019-03-02 VITALS — BP 142/86 | HR 91 | Resp 10 | Ht 75.0 in | Wt 292.0 lb

## 2019-03-02 DIAGNOSIS — N2889 Other specified disorders of kidney and ureter: Secondary | ICD-10-CM

## 2019-03-02 DIAGNOSIS — I120 Hypertensive chronic kidney disease with stage 5 chronic kidney disease or end stage renal disease: Secondary | ICD-10-CM

## 2019-03-02 DIAGNOSIS — N185 Chronic kidney disease, stage 5: Secondary | ICD-10-CM

## 2019-03-02 DIAGNOSIS — N186 End stage renal disease: Secondary | ICD-10-CM | POA: Diagnosis not present

## 2019-03-02 DIAGNOSIS — K219 Gastro-esophageal reflux disease without esophagitis: Secondary | ICD-10-CM | POA: Diagnosis not present

## 2019-03-02 DIAGNOSIS — E1122 Type 2 diabetes mellitus with diabetic chronic kidney disease: Secondary | ICD-10-CM | POA: Diagnosis not present

## 2019-03-02 DIAGNOSIS — E1110 Type 2 diabetes mellitus with ketoacidosis without coma: Secondary | ICD-10-CM

## 2019-03-02 DIAGNOSIS — I10 Essential (primary) hypertension: Secondary | ICD-10-CM

## 2019-03-02 DIAGNOSIS — E781 Pure hyperglyceridemia: Secondary | ICD-10-CM

## 2019-03-02 NOTE — Telephone Encounter (Signed)
A fax was sent to the patient's cardiologist at [facility] for cardiac clearance for his upcoming surgery on 03/27/2019 with Dr. Delana Meyer.

## 2019-03-02 NOTE — Progress Notes (Signed)
MRN : [id number]  [name] is a 53 y.o. (1966-01-21) male who presents with chief complaint of  Chief Complaint  Patient presents with  . New Patient (Initial Visit)  .  History of Present Illness:   The patient is seen for evaluation for dialysis access. The patient has chronic renal insufficiency stage V secondary to hypertension and diabetes. The patient's most recent creatinine clearance is less than 20, his cr in February was >6.0. The patient volume status has not yet become an issue. Patient's blood pressures been relatively well controlled. There are mild uremic symptoms which appear to be relatively well tolerated at this time. The patient is right-handed.  The patient has been considering the various methods of dialysis and wishes to proceed with hemodialysis and therefore creation of AV access.  The patient denies amaurosis fugax or recent TIA symptoms. There are no recent neurological changes noted. The patient denies claudication symptoms or rest pain symptoms. The patient denies history of DVT, PE or superficial thrombophlebitis. The patient denies recent episodes of angina or shortness of breath.   Current Meds  Medication Sig  . atorvastatin (LIPITOR) 80 MG tablet Take 1 tablet (80 mg total) by mouth daily at 6 PM.    Past Medical History:  Diagnosis Date  . Chronic kidney disease (CKD), stage III (moderate) (HCC)    Cr was 2.15 (02/2016)  . Chronic pain   . Diabetes mellitus    insulin-dependent  . Hyperlipemia   . Hypertension   . Schizophrenia [facility])     Past Surgical History:  Procedure Laterality Date  . AMPUTATION TOE Left 2014  . APPENDECTOMY      Social History Social History   Tobacco Use  . Smoking status: Never Smoker  . Smokeless tobacco: Never Used  Substance Use Topics  . Alcohol use: No    Comment: occ  . Drug use: No    Family History No family history on file. No family history of bleeding/clotting disorders, porphyria or  autoimmune disease   Allergies  Allergen Reactions  . Erythromycin Base Other (See Comments)    Reaction: heart flutter  . Metformin And Related Hives and Other (See Comments)    Reaction: sweating  . Percocet [Oxycodone-Acetaminophen] Nausea And Vomiting     REVIEW OF SYSTEMS (Negative unless checked)  Constitutional: [] Weight loss  [] Fever  [] Chills Cardiac: [] Chest pain   [] Chest pressure   [] Palpitations   [] Shortness of breath when laying flat   [x] Shortness of breath with exertion. Vascular:  [] Pain in legs with walking   [] Pain in legs at rest  [] History of DVT   [] Phlebitis   [x] Swelling in legs   [] Varicose veins   [] Non-healing ulcers Pulmonary:   [] Uses home oxygen   [] Productive cough   [] Hemoptysis   [] Wheeze  [] COPD   [] Asthma Neurologic:  [] Dizziness   [] Seizures   [] History of stroke   [] History of TIA  [] Aphasia   [] Vissual changes   [] Weakness or numbness in arm   [] Weakness or numbness in leg Musculoskeletal:   [] Joint swelling   [] Joint pain   [] Low back pain Hematologic:  [] Easy bruising  [] Easy bleeding   [] Hypercoagulable state   [] Anemic Gastrointestinal:  [] Diarrhea   [] Vomiting  [] Gastroesophageal reflux/heartburn   [] Difficulty swallowing. Genitourinary:  [x] Chronic kidney disease   [] Difficult urination  [] Frequent urination   [] Blood in urine Skin:  [] Rashes   [] Ulcers  Psychological:  [] History of anxiety   []  History of major  depression.  Physical Examination  Vitals:   03/02/19 1011  BP: (!) 142/86  Pulse: 91  Resp: 10  Weight: 292 lb (132.5 kg)  Height: 6\' 3"  (1.905 m)   Body mass index is 36.5 kg/m. Gen: WD/WN, NAD Head: [place]/AT, No temporalis wasting.  Ear/Nose/Throat: Hearing grossly intact, nares w/o erythema or drainage, poor dentition Eyes: PER, EOMI, sclera nonicteric.  Neck: Supple, no masses.  No bruit or JVD.  Pulmonary:  Good air movement, clear to auscultation bilaterally, no use of accessory muscles.  Cardiac: RRR, normal S1,  S2, no Murmurs. Vascular:  Large left antecubital vein Vessel Right Left  Radial Palpable Palpable  Brachial Palpable Palpable  Carotid Palpable Palpable  Gastrointestinal: soft, non-distended. No guarding/no peritoneal signs.  Musculoskeletal: M/S 5/5 throughout.  No deformity or atrophy.  Neurologic: CN 2-12 intact. Pain and light touch intact in extremities.  Symmetrical.  Speech is fluent. Motor exam as listed above. Psychiatric: Judgment intact, Mood & affect appropriate for pt's clinical situation. Dermatologic: No rashes or ulcers noted.  No changes consistent with cellulitis. Lymph : No Cervical lymphadenopathy, no lichenification or skin changes of chronic lymphedema.  CBC Lab Results  Component Value Date   WBC 6.4 01/06/2019   HGB 7.0 (L) 01/06/2019   HCT 22.8 (L) 01/06/2019   MCV 93.8 01/06/2019   PLT 341 01/06/2019    BMET    Component Value Date/Time   NA 137 06/29/2016 0657   K 4.0 06/29/2016 0657   CL 104 06/29/2016 0657   CO2 27 06/29/2016 0657   GLUCOSE 166 (H) 06/29/2016 0657   BUN 16 06/29/2016 0657   CREATININE 1.64 (H) 06/29/2016 0657   CALCIUM 8.6 (L) 06/29/2016 0657   GFRNONAA 48 (L) 06/29/2016 0657   GFRAA 56 (L) 06/29/2016 0657   CrCl cannot be calculated (Patient's most recent lab result is older than the maximum 21 days allowed.).  COAG No results found for: INR, PROTIME  Radiology No results found.    Assessment/Plan 1. Chronic renal insufficiency, stage V (HCC) Recommend:  At this time the patient does not have appropriate extremity access for dialysis  Patient should have a left brachial cephalic fistula created.  The risks, benefits and alternative therapies were reviewed in detail with the patient.  All questions were answered.  The patient agrees to proceed with surgery.    2. Essential hypertension Continue antihypertensive medications as already ordered, these medications have been reviewed and there are no changes at this  time.   3. Gastroesophageal reflux disease without esophagitis Continue PPI as already ordered, this medication has been reviewed and there are no changes at this time.  Avoidence of caffeine and alcohol  Moderate elevation of the head of the bed   4. DM (diabetes mellitus) type 2, uncontrolled, with ketoacidosis ([place]) Continue hypoglycemic medications as already ordered, these medications have been reviewed and there are no changes at this time.  Hgb A1C to be monitored as already arranged by primary service   5. Hypertriglyceridemia Continue statin as ordered and reviewed, no changes at this time     Hortencia Pilar, MD  03/02/2019 10:13 AM

## 2019-03-24 ENCOUNTER — Other Ambulatory Visit: Payer: Self-pay

## 2019-03-24 ENCOUNTER — Encounter
Admission: RE | Admit: 2019-03-24 | Discharge: 2019-03-24 | Disposition: A | Discharge status: Home / Self Care | Payer: Medicaid Other | Source: Ambulatory Visit | Attending: Vascular Surgery | Admitting: Vascular Surgery

## 2019-03-24 ENCOUNTER — Other Ambulatory Visit (INDEPENDENT_AMBULATORY_CARE_PROVIDER_SITE_OTHER): Payer: Self-pay | Admitting: Nurse Practitioner

## 2019-03-24 DIAGNOSIS — Z01818 Encounter for other preprocedural examination: Secondary | ICD-10-CM | POA: Diagnosis present

## 2019-03-24 DIAGNOSIS — R9431 Abnormal electrocardiogram [ECG] [EKG]: Secondary | ICD-10-CM | POA: Diagnosis not present

## 2019-03-24 HISTORY — DX: Anemia, unspecified: D64.9

## 2019-03-24 HISTORY — DX: Sleep apnea, unspecified: G47.30

## 2019-03-24 HISTORY — DX: Gastro-esophageal reflux disease without esophagitis: K21.9

## 2019-03-24 LAB — BASIC METABOLIC PANEL
Anion gap: 13 (ref 5–15)
BUN: 78 mg/dL — ABNORMAL HIGH (ref 6–20)
CO2: 24 mmol/L (ref 22–32)
Calcium: 7.6 mg/dL — ABNORMAL LOW (ref 8.9–10.3)
Chloride: 97 mmol/L — ABNORMAL LOW (ref 98–111)
Creatinine, Ser: 8.77 mg/dL — ABNORMAL HIGH (ref 0.61–1.24)
GFR calc Af Amer: 7 mL/min — ABNORMAL LOW (ref >60)
GFR calc non Af Amer: 6 mL/min — ABNORMAL LOW (ref >60)
Glucose, Bld: 54 mg/dL — ABNORMAL LOW (ref 70–99)
Potassium: 4 mmol/L (ref 3.5–5.1)
Sodium: 134 mmol/L — ABNORMAL LOW (ref 135–145)

## 2019-03-24 LAB — CBC WITH DIFFERENTIAL/PLATELET
Abs Immature Granulocytes: 0.12 10*3/uL — ABNORMAL HIGH (ref 0.00–0.07)
Basophils Absolute: 0 10*3/uL (ref 0.0–0.1)
Basophils Relative: 0 %
Eosinophils Absolute: 0.1 10*3/uL (ref 0.0–0.5)
Eosinophils Relative: 1 %
HCT: 24.1 % — ABNORMAL LOW (ref 39.0–52.0)
Hemoglobin: 7.4 g/dL — ABNORMAL LOW (ref 13.0–17.0)
Immature Granulocytes: 1 %
Lymphocytes Relative: 5 %
Lymphs Abs: 0.8 10*3/uL (ref 0.7–4.0)
MCH: 26.7 pg (ref 26.0–34.0)
MCHC: 30.7 g/dL (ref 30.0–36.0)
MCV: 87 fL (ref 80.0–100.0)
Monocytes Absolute: 1.2 10*3/uL — ABNORMAL HIGH (ref 0.1–1.0)
Monocytes Relative: 7 %
Neutro Abs: 13.6 10*3/uL — ABNORMAL HIGH (ref 1.7–7.7)
Neutrophils Relative %: 86 %
Platelets: 329 10*3/uL (ref 150–400)
RBC: 2.77 MIL/uL — ABNORMAL LOW (ref 4.22–5.81)
RDW: 15.8 % — ABNORMAL HIGH (ref 11.5–15.5)
WBC: 15.9 10*3/uL — ABNORMAL HIGH (ref 4.0–10.5)
nRBC: 0 % (ref 0.0–0.2)

## 2019-03-24 LAB — TYPE AND SCREEN
ABO/RH(D): O POS
Antibody Screen: NEGATIVE

## 2019-03-24 LAB — APTT: aPTT: 56 seconds — ABNORMAL HIGH (ref 24–36)

## 2019-03-24 LAB — PROTIME-INR
INR: 1.7 — ABNORMAL HIGH (ref 0.8–1.2)
Prothrombin Time: 19.8 seconds — ABNORMAL HIGH (ref 11.4–15.2)

## 2019-03-24 NOTE — Patient Instructions (Signed)
Your procedure is scheduled on: 04-03-19 FRIDAY Report to Same Day Surgery 2nd floor medical mall [facility] Entrance-take elevator on left to 2nd floor.  Check in with surgery information desk.) To find out your arrival time please call [phone] between 1PM - 3PM on 04-02-19 THURSDAY  Remember: Instructions that are not followed completely may result in serious medical risk, up to and including death, or upon the discretion of your surgeon and anesthesiologist your surgery may need to be rescheduled.    _x___ 1. Do not eat food after midnight the night before your procedure. NO GUM OR CANDY AFTER MIDNIGHT. You may drink WATER up to 2 hours before you are scheduled to arrive at the hospital for your procedure.  Do not drink WATER within 2 hours of your scheduled arrival to the hospital.  Type 1 and type 2 diabetics should only drink water.   ____Ensure clear carbohydrate drink on the way to the hospital for bariatric patients  ____Ensure clear carbohydrate drink 3 hours before surgery for Dr Dwyane Luo patients if physician instructed.     __x__ 2. No Alcohol for 24 hours before or after surgery.   __x__3. No Smoking or e-cigarettes for 24 prior to surgery.  Do not use any chewable tobacco products for at least 6 hour prior to surgery   ____  4. Bring all medications with you on the day of surgery if instructed.    __x__ 5. Notify your doctor if there is any change in your medical condition     (cold, fever, infections).    x___6. On the morning of surgery brush your teeth with toothpaste and water.  You may rinse your mouth with mouth wash if you wish.  Do not swallow any toothpaste or mouthwash.   Do not wear jewelry, make-up, hairpins, clips or nail polish.  Do not wear lotions, powders, or perfumes. You may wear deodorant.  Do not shave 48 hours prior to surgery. Men may shave face and neck.  Do not bring valuables to the hospital.    [facility] is not responsible for any  belongings or valuables.               Contacts, dentures or bridgework may not be worn into surgery.  Leave your suitcase in the car. After surgery it may be brought to your room.  For patients admitted to the hospital, discharge time is determined by your treatment team.  _  Patients discharged the day of surgery will not be allowed to drive home.  You will need someone to drive you home and stay with you the night of your procedure.    Please read over the following fact sheets that you were given:   [facility] Preparing for Surgery  _x___ TAKE THE FOLLOWING MEDICATION THE MORNING OF SURGERY WITH A SMALL SIP OF WATER. These include:  1. AMLODIPINE (NORVASC)  2. WELLBUTRIN (BUPROPION)  3. BUSPAR (BUSPIRONE)  4. CYMBALTA (DULOXETINE)  5. SYNTHROID (LEVOTHYROXINE)  6. IMDUR (ISOSORBIDE)   7.PRILOSEC (OMEPRAZOLE)  8.TAKE AN EXTRA PRILOSEC THE NIGHT BEFORE YOUR SURGERY  ____Fleets enema or Magnesium Citrate as directed.   _x___ Use CHG Soap or sage wipes as directed on instruction sheet   ____ Use inhalers on the day of surgery and bring to hospital day of surgery  ____ Stop Metformin and Janumet 2 days prior to surgery.    _X___ Take 1/2 of usual insulin dose the night before surgery and none on the morning surgery-TAKE  HALF OF YOUR LANTUS THE NIGHT BEFORE YOUR SURGERY AND NO INSULIN THE MORNING OF SURGERY  _x___ Follow recommendations from Cardiologist, Pulmonologist or PCP regarding stopping Aspirin, Coumadin, Plavix ,Eliquis, Effient, or Pradaxa, and Pletal-CONTINUE PLAVIX-STOP YOUR ELIQUIS 3 DAYS PRIOR TO SURGERY (LAST DOSE OF ELIQUIS Monday, June 1ST)  X____Stop Anti-inflammatories such as Advil, Aleve, Ibuprofen, Motrin, Naproxen, Naprosyn, Goodies powders or aspirin products. OK to take Tylenol OR HYDROCODONE IF NEEDED   _x___ Stop supplements until after surgery.     _X___ Bring C-Pap to the hospital.

## 2019-03-24 NOTE — Pre-Procedure Instructions (Signed)
CALLED PT REGARDING GLUCOSE OF 54-PT STATES THAT HE HAD NOT EATEN WHEN HE CAME TO [facility] THIS AM-PT STATES AFTER HE LEFT [facility] HE ATE-ENCOURAGED PT TO RECHECK BLOOD SUGAR BEFORE TAKING HIS INSULIN AND IF CONTINUES TO BE LOW HE MAY NEED TO CONTACT PCP-PT STATES GLUCOSE HAS BEEN RUNNING LOW A LOT RECENTLY-PT VERBALIZED UNDERSTANDING

## 2019-03-30 NOTE — Pre-Procedure Instructions (Signed)
ECG 12 Lead2/19/2020 [facility] Component Name Value Ref Range  EKG Systolic BP  mmHg  EKG Diastolic BP  mmHg  EKG Ventricular Rate 97 BPM  EKG Atrial Rate 97 BPM  EKG P-R Interval 182 ms  EKG QRS Duration 106 ms  EKG Q-T Interval 350 ms  EKG QTC Calculation 444 ms  EKG Calculated P Axis -12 degrees  EKG Calculated R Axis 19 degrees  EKG Calculated T Axis 113 degrees  QTC Fredericia 410 ms  Result Narrative  NORMAL SINUS RHYTHM CANNOT RULE OUT INFERIOR INFARCT , AGE UNDETERMINED ABNORMAL ECG WHEN COMPARED WITH ECG OF 14-Dec-2018 22:19, NO SIGNIFICANT CHANGE WAS FOUND Confirmed by Hunt Oris [id number]) on 12/17/2018 3:22:22 PM  Other Result Information  Interface, Rad Results In - 12/17/2018  3:22 PM EST NORMAL SINUS RHYTHM CANNOT RULE OUT INFERIOR INFARCT , AGE UNDETERMINED ABNORMAL ECG WHEN COMPARED WITH ECG OF 14-Dec-2018 22:19, NO SIGNIFICANT CHANGE WAS FOUND Confirmed by Hunt Oris ([id number]) on 12/17/2018 3:22:22 PM  Status Results Details   Encounter Summary

## 2019-03-31 ENCOUNTER — Other Ambulatory Visit: Admission: RE | Admit: 2019-03-31 | Payer: Medicaid Other | Source: Ambulatory Visit

## 2019-03-31 ENCOUNTER — Telehealth (INDEPENDENT_AMBULATORY_CARE_PROVIDER_SITE_OTHER): Payer: Self-pay

## 2019-03-31 NOTE — Telephone Encounter (Signed)
Patient called stating that he is in the hospital at [facility] and wanted to cancel his surgery on 04/03/2019 with Dr. Delana Meyer. The patient has been canceled.

## 2019-04-03 ENCOUNTER — Encounter: Admission: RE | Payer: Self-pay | Source: Home / Self Care

## 2019-04-03 ENCOUNTER — Ambulatory Visit: Admission: RE | Admit: 2019-04-03 | Payer: Medicaid Other | Source: Home / Self Care | Admitting: Vascular Surgery

## 2019-04-03 SURGERY — ARTERIOVENOUS (AV) FISTULA CREATION
Anesthesia: General | Laterality: Left

## 2019-11-30 DEATH — deceased
